# Patient Record
Sex: Female | Born: 1965 | Race: White | Hispanic: No | Marital: Married | Smoking: Never smoker
Health system: Southern US, Community
[De-identification: ages and names within clinical notes are randomized; demographics above are authoritative.]

## PROBLEM LIST (undated history)

## (undated) DIAGNOSIS — R87629 Unspecified abnormal cytological findings in specimens from vagina: Secondary | ICD-10-CM

## (undated) DIAGNOSIS — N6019 Diffuse cystic mastopathy of unspecified breast: Secondary | ICD-10-CM

## (undated) DIAGNOSIS — F32A Depression, unspecified: Secondary | ICD-10-CM

## (undated) DIAGNOSIS — F419 Anxiety disorder, unspecified: Secondary | ICD-10-CM

## (undated) DIAGNOSIS — H9190 Unspecified hearing loss, unspecified ear: Secondary | ICD-10-CM

## (undated) DIAGNOSIS — K219 Gastro-esophageal reflux disease without esophagitis: Secondary | ICD-10-CM

## (undated) DIAGNOSIS — K644 Residual hemorrhoidal skin tags: Secondary | ICD-10-CM

## (undated) DIAGNOSIS — H9319 Tinnitus, unspecified ear: Secondary | ICD-10-CM

## (undated) DIAGNOSIS — F329 Major depressive disorder, single episode, unspecified: Secondary | ICD-10-CM

## (undated) HISTORY — DX: Unspecified abnormal cytological findings in specimens from vagina: R87.629

## (undated) HISTORY — DX: Gastro-esophageal reflux disease without esophagitis: K21.9

## (undated) HISTORY — DX: Diffuse cystic mastopathy of unspecified breast: N60.19

## (undated) HISTORY — DX: Residual hemorrhoidal skin tags: K64.4

## (undated) HISTORY — DX: Major depressive disorder, single episode, unspecified: F32.9

## (undated) HISTORY — DX: Unspecified hearing loss, unspecified ear: H91.90

## (undated) HISTORY — DX: Anxiety disorder, unspecified: F41.9

## (undated) HISTORY — DX: Depression, unspecified: F32.A

## (undated) HISTORY — DX: Tinnitus, unspecified ear: H93.19

---

## 1997-07-17 HISTORY — PX: TENDON RELEASE: SHX230

## 1997-07-17 HISTORY — PX: BREAST CYST ASPIRATION: SHX578

## 1999-11-29 ENCOUNTER — Other Ambulatory Visit: Admission: RE | Admit: 1999-11-29 | Discharge: 1999-11-29 | Payer: Self-pay | Admitting: Family Medicine

## 2001-02-06 ENCOUNTER — Other Ambulatory Visit: Admission: RE | Admit: 2001-02-06 | Discharge: 2001-02-06 | Payer: Self-pay | Admitting: Family Medicine

## 2002-12-30 ENCOUNTER — Other Ambulatory Visit: Admission: RE | Admit: 2002-12-30 | Discharge: 2002-12-30 | Payer: Self-pay | Admitting: Obstetrics and Gynecology

## 2003-05-31 ENCOUNTER — Inpatient Hospital Stay (HOSPITAL_COMMUNITY): Admission: AD | Admit: 2003-05-31 | Discharge: 2003-05-31 | Payer: Self-pay | Admitting: Obstetrics and Gynecology

## 2003-07-01 ENCOUNTER — Inpatient Hospital Stay (HOSPITAL_COMMUNITY): Admission: AD | Admit: 2003-07-01 | Discharge: 2003-07-04 | Payer: Self-pay | Admitting: Obstetrics and Gynecology

## 2003-08-18 ENCOUNTER — Other Ambulatory Visit: Admission: RE | Admit: 2003-08-18 | Discharge: 2003-08-18 | Payer: Self-pay | Admitting: Obstetrics and Gynecology

## 2004-05-31 ENCOUNTER — Ambulatory Visit: Payer: Self-pay | Admitting: Family Medicine

## 2004-06-27 ENCOUNTER — Ambulatory Visit: Payer: Self-pay | Admitting: Internal Medicine

## 2004-07-07 ENCOUNTER — Ambulatory Visit: Payer: Self-pay | Admitting: Family Medicine

## 2004-08-17 ENCOUNTER — Ambulatory Visit: Payer: Self-pay | Admitting: Family Medicine

## 2004-08-20 ENCOUNTER — Ambulatory Visit: Payer: Self-pay | Admitting: Family Medicine

## 2004-10-13 ENCOUNTER — Ambulatory Visit: Payer: Self-pay | Admitting: Family Medicine

## 2004-12-08 ENCOUNTER — Ambulatory Visit: Payer: Self-pay | Admitting: Family Medicine

## 2005-02-10 ENCOUNTER — Ambulatory Visit: Payer: Self-pay | Admitting: Family Medicine

## 2005-05-17 ENCOUNTER — Ambulatory Visit: Payer: Self-pay | Admitting: Family Medicine

## 2006-04-16 ENCOUNTER — Encounter: Payer: Self-pay | Admitting: Family Medicine

## 2006-05-09 ENCOUNTER — Ambulatory Visit: Payer: Self-pay | Admitting: Family Medicine

## 2006-11-12 ENCOUNTER — Encounter: Payer: Self-pay | Admitting: Family Medicine

## 2006-11-12 DIAGNOSIS — H9190 Unspecified hearing loss, unspecified ear: Secondary | ICD-10-CM | POA: Insufficient documentation

## 2006-11-12 DIAGNOSIS — K219 Gastro-esophageal reflux disease without esophagitis: Secondary | ICD-10-CM | POA: Insufficient documentation

## 2006-11-12 DIAGNOSIS — H9319 Tinnitus, unspecified ear: Secondary | ICD-10-CM | POA: Insufficient documentation

## 2006-11-12 DIAGNOSIS — Z872 Personal history of diseases of the skin and subcutaneous tissue: Secondary | ICD-10-CM | POA: Insufficient documentation

## 2006-11-13 ENCOUNTER — Ambulatory Visit: Payer: Self-pay | Admitting: Family Medicine

## 2007-05-07 ENCOUNTER — Telehealth: Payer: Self-pay | Admitting: Family Medicine

## 2007-05-18 ENCOUNTER — Ambulatory Visit: Payer: Self-pay | Admitting: Family Medicine

## 2007-06-07 ENCOUNTER — Ambulatory Visit: Payer: Self-pay | Admitting: Family Medicine

## 2007-06-14 ENCOUNTER — Ambulatory Visit: Payer: Self-pay | Admitting: Family Medicine

## 2007-06-14 DIAGNOSIS — J019 Acute sinusitis, unspecified: Secondary | ICD-10-CM | POA: Insufficient documentation

## 2008-04-20 ENCOUNTER — Ambulatory Visit: Payer: Self-pay | Admitting: Family Medicine

## 2008-06-04 ENCOUNTER — Encounter: Admission: RE | Admit: 2008-06-04 | Discharge: 2008-06-04 | Payer: Self-pay | Admitting: Obstetrics and Gynecology

## 2008-07-21 ENCOUNTER — Telehealth: Payer: Self-pay | Admitting: Family Medicine

## 2008-07-27 ENCOUNTER — Ambulatory Visit: Payer: Self-pay | Admitting: Family Medicine

## 2009-07-20 ENCOUNTER — Other Ambulatory Visit: Admission: RE | Admit: 2009-07-20 | Discharge: 2009-07-20 | Payer: Self-pay | Admitting: Family Medicine

## 2009-07-20 ENCOUNTER — Ambulatory Visit: Payer: Self-pay | Admitting: Family Medicine

## 2009-07-20 LAB — HM PAP SMEAR

## 2009-07-21 LAB — CONVERTED CEMR LAB
ALT: 19 units/L (ref 0–35)
Alkaline Phosphatase: 44 units/L (ref 39–117)
Basophils Relative: 0.8 % (ref 0.0–3.0)
Bilirubin, Direct: 0.1 mg/dL (ref 0.0–0.3)
Creatinine, Ser: 0.6 mg/dL (ref 0.4–1.2)
Eosinophils Absolute: 0.1 10*3/uL (ref 0.0–0.7)
Glucose, Bld: 89 mg/dL (ref 70–99)
Hemoglobin: 11.6 g/dL — ABNORMAL LOW (ref 12.0–15.0)
LDL Cholesterol: 120 mg/dL — ABNORMAL HIGH (ref 0–99)
MCHC: 32.3 g/dL (ref 30.0–36.0)
MCV: 91.6 fL (ref 78.0–100.0)
Monocytes Absolute: 0.5 10*3/uL (ref 0.1–1.0)
Monocytes Relative: 7.7 % (ref 3.0–12.0)
Neutro Abs: 4 10*3/uL (ref 1.4–7.7)
Potassium: 3.4 meq/L — ABNORMAL LOW (ref 3.5–5.1)
RBC: 3.92 M/uL (ref 3.87–5.11)
Sodium: 140 meq/L (ref 135–145)
Total Bilirubin: 0.9 mg/dL (ref 0.3–1.2)
VLDL: 14.2 mg/dL (ref 0.0–40.0)
WBC: 6.2 10*3/uL (ref 4.5–10.5)

## 2009-07-28 ENCOUNTER — Encounter (INDEPENDENT_AMBULATORY_CARE_PROVIDER_SITE_OTHER): Payer: Self-pay | Admitting: *Deleted

## 2009-08-11 ENCOUNTER — Encounter: Payer: Self-pay | Admitting: Family Medicine

## 2009-08-16 ENCOUNTER — Encounter (INDEPENDENT_AMBULATORY_CARE_PROVIDER_SITE_OTHER): Payer: Self-pay

## 2009-08-17 ENCOUNTER — Ambulatory Visit: Payer: Self-pay | Admitting: Gastroenterology

## 2009-09-01 ENCOUNTER — Ambulatory Visit: Payer: Self-pay | Admitting: Gastroenterology

## 2009-09-27 ENCOUNTER — Encounter: Admission: RE | Admit: 2009-09-27 | Discharge: 2009-09-27 | Payer: Self-pay | Admitting: Family Medicine

## 2009-09-27 LAB — HM MAMMOGRAPHY: HM Mammogram: NORMAL

## 2009-09-30 DIAGNOSIS — N63 Unspecified lump in unspecified breast: Secondary | ICD-10-CM | POA: Insufficient documentation

## 2009-10-05 ENCOUNTER — Encounter: Payer: Self-pay | Admitting: Family Medicine

## 2009-10-12 ENCOUNTER — Ambulatory Visit: Payer: Self-pay | Admitting: Family Medicine

## 2009-10-15 ENCOUNTER — Encounter: Payer: Self-pay | Admitting: Family Medicine

## 2009-10-15 ENCOUNTER — Encounter: Admission: RE | Admit: 2009-10-15 | Discharge: 2009-10-15 | Payer: Self-pay | Admitting: Family Medicine

## 2009-10-18 ENCOUNTER — Encounter (INDEPENDENT_AMBULATORY_CARE_PROVIDER_SITE_OTHER): Payer: Self-pay | Admitting: *Deleted

## 2009-12-29 ENCOUNTER — Ambulatory Visit: Payer: Self-pay | Admitting: Family Medicine

## 2010-03-07 ENCOUNTER — Telehealth: Payer: Self-pay | Admitting: Family Medicine

## 2010-04-06 ENCOUNTER — Ambulatory Visit: Payer: Self-pay | Admitting: Family Medicine

## 2010-04-06 DIAGNOSIS — K644 Residual hemorrhoidal skin tags: Secondary | ICD-10-CM | POA: Insufficient documentation

## 2010-07-19 ENCOUNTER — Telehealth: Payer: Self-pay | Admitting: Family Medicine

## 2010-07-20 ENCOUNTER — Ambulatory Visit
Admission: RE | Admit: 2010-07-20 | Discharge: 2010-07-20 | Payer: Self-pay | Source: Home / Self Care | Attending: Internal Medicine | Admitting: Internal Medicine

## 2010-07-20 LAB — CONVERTED CEMR LAB
Nitrite: POSITIVE
Protein, U semiquant: UNDETERMINED

## 2010-07-21 ENCOUNTER — Encounter: Payer: Self-pay | Admitting: Family Medicine

## 2010-08-07 ENCOUNTER — Encounter: Payer: Self-pay | Admitting: Family Medicine

## 2010-08-18 NOTE — Procedures (Signed)
Summary: Colonoscopy  Patient: Frances Aguilar Note: All result statuses are Final unless otherwise noted.  Tests: (1) Colonoscopy (COL)   COL Colonoscopy           DONE     [facility]     [address]     [place], [place]  [address]           COLONOSCOPY PROCEDURE REPORT           PATIENT:  Frances Aguilar, Frances Aguilar  MR#:  [id number]     BIRTHDATE:  Dec 23, 1965, 43 yrs. old  GENDER:  female           ENDOSCOPIST:  Rachael Fee, MD     Referred by:  Marne A. Milinda Antis, M.D.           PROCEDURE DATE:  09/01/2009     PROCEDURE:  Colonoscopy, Diagnostic     ASA CLASS:  Class II     INDICATIONS:  Elevated Risk Screening.father had colon cancer in     his 22's           MEDICATIONS:   Fentanyl 75 mcg IV, Versed 7 mg IV           DESCRIPTION OF PROCEDURE:   After the risks benefits and     alternatives of the procedure were thoroughly explained, informed     consent was obtained.  Digital rectal exam was performed and     revealed no rectal masses.   The LB [id number]-[id number] endoscope     was introduced through the anus and advanced to the cecum, which     was identified by both the appendix and ileocecal valve, without     limitations.  The quality of the prep was excellent, using     MoviPrep.  The instrument was then slowly withdrawn as the colon     was fully examined.     <<PROCEDUREIMAGES>>           FINDINGS:  Mild diverticulosis was found sigmoid to descending     colon segments.  External hemorrhoids were found. These were     small, not thrombosed.  This was otherwise a normal examination of     the colon (see image1, image2, and image4).   Retroflexed views in     the rectum revealed no abnormalities.    The scope was then     withdrawn from the patient and the procedure completed.           COMPLICATIONS:  None           ENDOSCOPIC IMPRESSION:     1) Mild diverticulosis in the sigmoid to descending colon     segments     2) External hemorrhoids, small and not  thrombosed     3) Otherwise normal examination; no polyps or cancers           RECOMMENDATIONS:     1) Given your significant family history of colon cancer, you     should have a repeat colonoscopy in 5 years           REPEAT EXAM:  5 years           ______________________________     Rachael Fee, MD           n.     eSIGNED:   Rachael Fee at 09/01/2009 09:44 AM           Ranelle Oyster, [id number]  Note: An exclamation mark (!) indicates a result that was not dispersed into the flowsheet. Document Creation Date: 09/01/2009 9:44 AM _______________________________________________________________________  (1) Order result status: Final Collection or observation date-time: 09/01/2009 09:38 Requested date-time:  Receipt date-time:  Reported date-time:  Referring Physician:   Ordering Physician: Rob Bunting [phone]) Specimen Source:  Source: Launa Grill Order Number: [id number] Lab site:   Appended Document: Colonoscopy    Clinical Lists Changes  Observations: Added new observation of COLONNXTDUE: 08/2014 (09/01/2009 11:13)

## 2010-08-18 NOTE — Progress Notes (Signed)
Summary: wants to change birth control  Phone Note Call from Patient Call back at Home Phone [phone]   Caller: Patient Call For: [name] Summary of Call: Pt states she has been on depo provera since january.  She is continuing to have spotting everyday and she is having cramps in her feet.  She wants to change to something else, maybe lybrell pills.  [facility].  Her next depo injection is due 9/02, and she wants to get on something else in enough time to have continuous contraception. Initial call taken by: Lowella Petties CMA,  March 07, 2010 2:08 PM  Follow-up for Phone Call        px written on EMR for call in for lybrell start the pill when next depo shot is due f/u if menstrual problems do not improve Follow-up by: [name],  March 07, 2010 3:45 PM  Additional Follow-up for Phone Call Additional follow up Details #1::        [facility] as instructed. Patient notified as instructed by telephone. Pt wants to know if needs to use another form of contraception when she starts this new med and if so how many weeks.Lewanda Rife LPN  March 07, 2010 4:03 PM     Additional Follow-up for Phone Call Additional follow up Details #2::    yes- use barrier like condom for first 2 weeks Follow-up by: [name],  March 07, 2010 4:07 PM  Additional Follow-up for Phone Call Additional follow up Details #3:: Details for Additional Follow-up Action Taken: Patient notified as instructed by telephone. Lewanda Rife LPN  March 07, 2010 4:41 PM   New/Updated Medications: LYBREL 90-20 MCG TABS (LEVONORGESTREL-ETHINYL ESTRAD) take 1 by mouth once daily continuously Prescriptions: LYBREL 90-20 MCG TABS (LEVONORGESTREL-ETHINYL ESTRAD) take 1 by mouth once daily continuously  #90 x 3   Entered and Authorized by:   [name]   Signed by:   [name] on 03/07/2010   Method used:   Telephoned to ...       [facility]   [address] (retail)       [address]       [place], [place]  [address]       Ph: [phone] or [phone]       Fax: [phone]   RxID:   [id number]

## 2010-08-18 NOTE — Letter (Signed)
Summary: Results Follow up Letter  [place] [facility]  [address] [place], [place] [address]   Phone: [phone]  Fax: [phone]    10/18/2009 MRN: [id number]    Frances Aguilar [address] [place], [place]  [address]    Dear [name],  The following are the results of your recent test(s):  Test         Result    Pap Smear:        Normal _____  Not Normal _____ Comments: ______________________________________________________ Cholesterol: LDL(Bad cholesterol):         Your goal is less than:         HDL (Good cholesterol):       Your goal is more than: Comments:  ______________________________________________________ Mammogram:        Normal __X___  Not Normal _____ Comments:   Follow up breast ultrasound is negative.  Yearly follow up is recommended.   ___________________________________________________________________ Hemoccult:        Normal _____  Not normal _______ Comments:    _____________________________________________________________________ Other Tests:    We routinely do not discuss normal results over the telephone.  If you desire a copy of the results, or you have any questions about this information we can discuss them at your next office visit.   Sincerely,    Marne A. Milinda Antis, M.D.  MAT:lsf

## 2010-08-18 NOTE — Letter (Signed)
Summary: [facility]  [facility]   Imported By: Maryln Gottron 08/16/2009 15:50:40  _____________________________________________________________________  External Attachment:    Type:   Image     Comment:   External Document

## 2010-08-18 NOTE — Assessment & Plan Note (Signed)
Summary: depo shot/ alc  Nurse Visit   Allergies: 1)  ! * ? Pain Med From A Surgery 2)  ! * Relefan  Medication Administration  Injection # 1:    Medication: Depo-Provera 150mg     Diagnosis: CONTRACEPTIVE MANAGEMENT (ICD-V25.09)    Route: IM    Site: LUOQ gluteus    Exp Date: 10/16/2011    Lot #: [id number]    Mfr: Francisca December    Patient tolerated injection without complications    Given by: Lewanda Rife LPN (October 12, 2009 4:38 PM)  Orders Added: 1)  Depo-Provera 150mg  [J1055] 2)  Admin of Therapeutic Inj  intramuscular or subcutaneous [13086]

## 2010-08-18 NOTE — Letter (Signed)
Summary: [facility] Inst[facility]  [facility]  [address] [place], [place] [address]   Phone: [phone]  Fax: [phone]       Frances Aguilar    1965-09-22    MRN: [id number]        Procedure Day Dorna Bloom:  Wednesday 09/01/2009     Arrival Time:  9:00 am      Procedure Time:  10:00 am     Location of Procedure:                    _x _  [facility] (4th Floor)   PREPARATION FOR COLONOSCOPY WITH MOVIPREP   Starting 5 days prior to your procedure Friday 2/11 do not eat nuts, seeds, popcorn, corn, beans, peas,  salads, or any raw vegetables.  Do not take any fiber supplements (e.g. Metamucil, Citrucel, and Benefiber).  THE DAY BEFORE YOUR PROCEDURE         DATE: Tuesday 2/15  1.  Drink clear liquids the entire day-NO SOLID FOOD  2.  Do not drink anything colored red or purple.  Avoid juices with pulp.  No orange juice.  3.  Drink at least 64 oz. (8 glasses) of fluid/clear liquids during the day to prevent dehydration and help the prep work efficiently.  CLEAR LIQUIDS INCLUDE: Water Jello Ice Popsicles Tea (sugar ok, no milk/cream) Powdered fruit flavored drinks Coffee (sugar ok, no milk/cream) Gatorade Juice: apple, white grape, white cranberry  Lemonade Clear bullion, consomm, broth Carbonated beverages (any kind) Strained chicken noodle soup Hard Candy                             4.  In the morning, mix first dose of MoviPrep solution:    Empty 1 Pouch A and 1 Pouch B into the disposable container    Add lukewarm drinking water to the top line of the container. Mix to dissolve    Refrigerate (mixed solution should be used within 24 hrs)  5.  Begin drinking the prep at 5:00 p.m. The MoviPrep container is divided by 4 marks.   Every 15 minutes drink the solution down to the next mark (approximately 8 oz) until the full liter is complete.   6.  Follow completed prep with 16 oz of clear liquid of your choice (Nothing red or purple).   Continue to drink clear liquids until bedtime.  7.  Before going to bed, mix second dose of MoviPrep solution:    Empty 1 Pouch A and 1 Pouch B into the disposable container    Add lukewarm drinking water to the top line of the container. Mix to dissolve    Refrigerate  THE DAY OF YOUR PROCEDURE      DATE: Wednesday 2/16  Beginning at 5:00 am (5 hours before procedure):         1. Every 15 minutes, drink the solution down to the next mark (approx 8 oz) until the full liter is complete.  2. Follow completed prep with 16 oz. of clear liquid of your choice.    3. You may drink clear liquids until 8:00 am (2 HOURS BEFORE PROCEDURE).   MEDICATION INSTRUCTIONS  Unless otherwise instructed, you should take regular prescription medications with a small sip of water   as early as possible the morning of your procedure.         OTHER INSTRUCTIONS  You will need a responsible adult  at least 46 years of age to accompany you and drive you home.   This person must remain in the waiting room during your procedure.  Wear loose fitting clothing that is easily removed.  Leave jewelry and other valuables at home.  However, you may wish to bring a book to read or  an iPod/MP3 player to listen to music as you wait for your procedure to start.  Remove all body piercing jewelry and leave at home.  Total time from sign-in until discharge is approximately 2-3 hours.  You should go home directly after your procedure and rest.  You can resume normal activities the  day after your procedure.  The day of your procedure you should not:   Drive   Make legal decisions   Operate machinery   Drink alcohol   Return to work  You will receive specific instructions about eating, activities and medications before you leave.    The above instructions have been reviewed and explained to me by   Ulis Rias RN  August 17, 2009 8:19 AM     I fully understand and can verbalize these  instructions _____________________________ Date _________

## 2010-08-18 NOTE — Assessment & Plan Note (Signed)
Summary: pain w/ stool/alc   Vital Signs:  Patient profile:   45 year old female Height:      63.75 inches Weight:      150 pounds BMI:     26.04 Temp:     98.1 degrees F oral Pulse rate:   80 / minute Pulse rhythm:   regular BP sitting:   110 / 70  (left arm) Cuff size:   regular  Vitals Entered By: Linde Gillis CMA Duncan Dull) (April 06, 2010 3:10 PM) CC: hemorroids   History of Present Illness: has developed a hemorroid started on thursday  became painful 2 nights ago prep H soaking in hot tub  had one in the past - but this one is new   is not bleeding  no constipation  eats a lot of raw veg  sitting more at work  no heavy lifting   wt is down 19 lb   Allergies: 1)  ! * ? Pain Med From A Surgery 2)  ! * Relefan  Past History:  Past Medical History: Last updated: 07/20/2009 GERD hearing loss, tinnitis, and hearing aid eczema depression    psych- Dr Sharl Ma  Past Surgical History: Last updated: 11/12/2006 tendon release in R hand breast cyst aspiration 99  Family History: Last updated: 07/20/2009 father asthma, MI, CVA, ALZ, DM, cancer (colon/ prostate/ kidney)  sister asthma, all, DM mother DM brother cancer - non hodgekin's lymphoma   Social History: Last updated: 07/27/2008 non smoker   Risk Factors: Smoking Status: never (11/12/2006)  Review of Systems General:  Denies chills, fatigue, fever, and loss of appetite. CV:  Denies chest pain or discomfort and palpitations. Resp:  Denies cough. GI:  Complains of change in bowel habits, constipation, and hemorrhoids; denies abdominal pain, bloody stools, indigestion, nausea, and vomiting. GU:  Denies dysuria. Derm:  Denies rash. Heme:  Denies abnormal bruising.  Physical Exam  General:  Well-developed,well-nourished,in no acute distress; alert,appropriate and cooperative throughout examination Head:  normocephalic, atraumatic, and no abnormalities observed.   Mouth:  pharynx pink and  moist.   Neck:  supple with full rom and no masses or thyromegally, no JVD or carotid bruit  Lungs:  Normal respiratory effort, chest expands symmetrically. Lungs are clear to auscultation, no crackles or wheezes. Heart:  Normal rate and regular rhythm. S1 and S2 normal without gallop, murmur, click, rub or other extra sounds. Abdomen:  Bowel sounds positive,abdomen soft and non-tender without masses, organomegaly or hernias noted. Rectal:  large external hemorroid posteriorly- mildly tender and soft/ non thrombosed  nl rectal exm heme neg stool Skin:  Intact without suspicious lesions or rashes Cervical Nodes:  No lymphadenopathy noted Inguinal Nodes:  No significant adenopathy Psych:  normal affect, talkative and pleasant    Impression & Recommendations:  Problem # 1:  HEMORRHOIDS, EXTERNAL (ICD-455.3) Assessment New  external hemorroid- tender but not thrombosed  handout given from [facility]afp on hemorrhoid care  anusol hc cream two times a day  keep clean stressed imp of not straining at all  update if not imp   Orders: Prescription Created Electronically [phone])  Complete Medication List: 1)  Diprolene Af 0.05 % Crea (Aug betamethasone dipropionate) .... To affected area once daily as needed 2)  Zantac 150 Mg Tabs (Ranitidine hcl) .... Take one tablet by mouth daily 3)  Cymbalta 60 Mg Cpep (Duloxetine hcl) .... Take one by mouth daily 4)  Lybrel 90-20 Mcg Tabs (Levonorgestrel-ethinyl estrad) .... Take 1 by mouth once daily continuously 5)  Anusol-hc 2.5 % Crea (Hydrocortisone) .... Apply to affected area two times a day for 14 days  Patient Instructions: 1)  keep area clean  2)  avoid straining or getting constipated / avoid heavy lifting  3)  use the anusol hc cream and update me if not improving  4)  try some ice  Prescriptions: ANUSOL-HC 2.5 % CREA (HYDROCORTISONE) apply to affected area two times a day for 14 days  #1 medium x 0   Entered and Authorized by:   [name]   Signed by:   [name] on 04/06/2010   Method used:   [facility]        [facility]  [address] ([facility]       [address]       [place], [place]  [address]       Ph: [phone] or [phone]       Fax: [phone]   RxID:   [id number]   Current Allergies (reviewed today): ! * ? PAIN MED FROM A SURGERY ! * RELEFAN

## 2010-08-18 NOTE — Miscellaneous (Signed)
Summary: Lec previsit  Clinical Lists Changes  Medications: Added new medication of MOVIPREP 100 GM  SOLR (PEG-KCL-NACL-NASULF-NA ASC-C) As per prep instructions. - Signed Rx of MOVIPREP 100 GM  SOLR (PEG-KCL-NACL-NASULF-NA ASC-C) As per prep instructions.;  #1 x 0;  Signed;  Entered by: Ulis Rias RN;  Authorized by: Rachael Fee MD;  Method used: [facility]  [address] [place], [place]  [address], Ph: [phone] or [phone], Fax: [phone] Allergies: Added new allergy or adverse reaction of * RELEFAN    Prescriptions: MOVIPREP 100 GM  SOLR (PEG-KCL-NACL-NASULF-NA ASC-C) As per prep instructions.  #1 x 0   Entered by:   Ulis Rias RN   Authorized by:   Rachael Fee MD   Signed by:   Ulis Rias RN on 08/17/2009   Method used:   [facility] to        [facility]  [address] (retail)       [address]       [place], [place]  [address]       Ph: [phone] or [phone]       Fax: [phone]   RxID:   [id number]

## 2010-08-18 NOTE — Progress Notes (Signed)
Summary: call a nurse   Phone Note Call from Patient   Call For: [name] Summary of Call: Triage Record Num: [id number] Operator: Patriciaann Clan Patient Name: Frances Aguilar Call Date & Time: 07/18/2010 1:39:01PM Patient Phone: [phone] PCP: Audrie Gallus. Kalab Camps Patient Gender: Female PCP Fax : Patient DOB: 09-25-1965 Practice Name: [facility] Reason for Call: LMP-has Mirena IUD. Patient calling. States developed urianry urgency, frequency, burning with uriantion. States accompanied by heamturia. Onset 07/18/10 a.m. Denies flank pain or abdominal pain. Afebrile. Patient states she purchased OTC Azo and is going to try Azo. Care advice given per guidelines. Advised increased fluids, avoid carbonated, caffienated, surgery, spicy beverages/foods. Advised Ibuprofen, increased water. Call back parameters reviewed. Patient advised to be seen in UC/ED if fever, flank pain, increased sx. Advised to call office 07/19/10 for appt. Patient verbalizes understanding and agreeable. Protocol(s) Used: Bloody Urine Protocol(s) Used: Urinary Symptoms - Female Recommended Outcome per Protocol: See Provider within 24 hours Reason for Outcome: Blood in urine Has one or more urinary tract symptoms Care Advice:  ~ Go to the ED IMMEDIATELY if repeated vomiting or severe pain occurs.  ~ Call provider if you develop flank or low back pain, fever, chills, or generally feel sick.  ~ Tell provider medical history of renal disease; especially if have only one kidney.  ~ SYMPTOM / CONDITION MANAGEMENT Limit carbonated, alcoholic, and caffeinated beverages such as coffee, tea and soda. Avoid nonprescription cold and allergy medications that contain caffeine. Limit intake of tomatoes, fruit juices (except for unsweetened cranberry juice), dairy products, spicy foods, sugar, and artificial sweeteners (aspartame or saccharine). Stop or decrease smoking. Reducing exposure to bladder irritants may help lessen  urgency.  ~ Increase intake of fluids to 8-16 eight oz. glasses (2,000 to 4,000 cc per day or 1.6 to 3.2 L) so urine is a pale yellow color, to help flush bacte Initial call taken by: Melody Comas,  July 19, 2010 11:34 AM

## 2010-08-18 NOTE — Assessment & Plan Note (Signed)
Summary: Frances Aguilar / [name]  Nurse Visit   Allergies: 1)  ! * ? Pain Med From A Surgery 2)  ! * Relefan  Medication Administration  Injection # 1:    Medication: Depo-Provera 150mg     Diagnosis: CONTRACEPTIVE MANAGEMENT (ICD-V25.09)    Route: IM    Site: RUOQ gluteus    Exp Date: 06/15/2012    Lot #: [id number]    Mfr: Francisca December    Patient tolerated injection without complications    Given by: Delilah Shan CMA Duncan Dull) (December 29, 2009 8:46 AM)  Orders Added: 1)  Admin of Therapeutic Inj  intramuscular or subcutaneous [96372] 2)  Depo-Provera 150mg  [J1055]   Medication Administration  Injection # 1:    Medication: Depo-Provera 150mg     Diagnosis: CONTRACEPTIVE MANAGEMENT (ICD-V25.09)    Route: IM    Site: RUOQ gluteus    Exp Date: 06/15/2012    Lot #: [id number]    Mfr: Francisca December    Patient tolerated injection without complications    Given by: Delilah Shan CMA Duncan Dull) (December 29, 2009 8:46 AM)  Orders Added: 1)  Admin of Therapeutic Inj  intramuscular or subcutaneous [96372] 2)  Depo-Provera 150mg  [J1055]   Appended Document: DEPO SHOT / [name] Due again between Aug 30 and Sept 14.

## 2010-08-18 NOTE — Miscellaneous (Signed)
Summary: pap results  Clinical Lists Changes  Observations: Added new observation of PAP SMEAR: normal (07/28/2009 10:48)      Preventive Care Screening  Pap Smear:    Date:  07/28/2009    Results:  normal

## 2010-08-18 NOTE — Assessment & Plan Note (Signed)
Summary: uit/alc   Vital Signs:  Patient profile:   45 year old female Weight:      151.50 pounds Temp:     98.6 degrees F oral Pulse rate:   80 / minute Pulse rhythm:   regular BP sitting:   122 / 76  (left arm) Cuff size:   regular  Vitals Entered By: Selena Batten Dance CMA (AAMA) (July 20, 2010 11:30 AM) CC: ? UTI/ Check left eye Comments Mirena placed 2 weeks ago   History of Present Illness: CC: ?kidney infection  3d h/o frequency, blood in urine, burning sensation at end of voiding, + urgency.  + suprapubic pain when going to bathroom.  No fevers/chills, n/v, back pain.  took Azo which didn't seem to help.  Drinking cranberry juice and water.  L eye - ? allergic reaction to makeup.  + burning and itching.  has put on some lotion and vaseline.  Has stopped makeup.  + itching around eye.  No eye problems, vision changes.  has lost 30 lbs in last year!  with dietary changes, less carbs, more fruits and protein  Current Medications (verified): 1)  Diprolene Af 0.05 % Crea (Aug Betamethasone Dipropionate) .... To Affected Area Once Daily As Needed 2)  Zantac 150 Mg Tabs (Ranitidine Hcl) .... Take One Tablet By Mouth Daily 3)  Cymbalta 60 Mg Cpep (Duloxetine Hcl) .... Take One By Mouth Daily 4)  Mirena 20 Mcg/24hr Iud (Levonorgestrel) .... Inserted  Allergies: 1)  ! * ? Pain Med From A Surgery 2)  ! * Relefan  Past History:  Past Medical History: Last updated: 07/20/2009 GERD hearing loss, tinnitis, and hearing aid eczema depression    psych- Dr Sharl Ma  Social History: Last updated: 07/27/2008 non smoker   Review of Systems       per HPI  Physical Exam  General:  Well-developed,well-nourished,in no acute distress; alert,appropriate and cooperative throughout examination Head:  normocephalic, atraumatic, and no abnormalities observed.   Eyes:  vision grossly intact, pupils equal, pupils round, and pupils reactive to light.  no conjunctival pallor, injection or  icterus .  + dry erythematous patch superior to R eyelid, + L lower eyelid swollen and erythematous Ears:  Tms clear.  hearing aids. Nose:  externally withoutdeformity Mouth:  MMM, no pharyngeal erythema Neck:  no LAD Lungs:  Normal respiratory effort, chest expands symmetrically. Lungs are clear to auscultation, no crackles or wheezes. Heart:  Normal rate and regular rhythm. S1 and S2 normal without gallop, murmur, click, rub or other extra sounds. Abdomen:  Bowel sounds positive,abdomen soft and non-tender without masses, organomegaly or hernias noted.  + suprapubic pressure. Pulses:  2+ rad pulses Extremities:  no pedal edema   Impression & Recommendations:  Problem # 1:  UTI (ICD-599.0) UA/micro consistent with UTI.  treat with cipro x 5 days.  UCx sent.  update if red flags.  Her updated medication list for this problem includes:    Ciprofloxacin Hcl 500 Mg Tabs (Ciprofloxacin hcl) .Marland Kitchen... Take one twice daily x 5 days  Orders: UA Dipstick W/ Micro (manual) ([address]) Specimen Handling (99000) T-Culture, Urine ([id number])  Problem # 2:  ECZEMA, HX OF (ICD-V13.3) under eyelid.  given near eye, ticrolimus.  abstain from makeup for next few weeks.  update if not better.  Complete Medication List: 1)  Diprolene Af 0.05 % Crea (Aug betamethasone dipropionate) .... To affected area once daily as needed 2)  Zantac 150 Mg Tabs (Ranitidine hcl) .... Take one tablet  by mouth daily 3)  Cymbalta 60 Mg Cpep (Duloxetine hcl) .... Take one by mouth daily 4)  Mirena 20 Mcg/24hr Iud (Levonorgestrel) .... Inserted 5)  Protopic 0.1 % Oint (Tacrolimus) .... Apply to aa on eyelid two times a day 6)  Ciprofloxacin Hcl 500 Mg Tabs (Ciprofloxacin hcl) .... Take one twice daily x 5 days  Patient Instructions: 1)  Looks like urinary infection.  treat with antibiotics twice daily for 5days.  let us konw if not improving as expected 2)  for eyelid, use protopic, avoid makeup for next 1-2 wks. 3)  Call  us if any worsening of eyelid, any fevers/chills, nausea, or not improving as expected. 4)   Continue to push fluids and get plenty of rest. Prescriptions: CIPROFLOXACIN HCL 500 MG TABS (CIPROFLOXACIN HCL) take one twice daily x 5 days  #10 x 0   Entered and Authorized by:   Eustaquio Boyden  MD   Signed by:   Eustaquio Boyden  MD on 07/20/2010   Method used:   [facility]        [facility]       [address]       [place], [place]  [address]       Ph: [phone] or [phone]       Fax: [phone]   RxID:   [id number] PROTOPIC 0.1 % OINT (TACROLIMUS) apply to AA on eyelid two times a day  #1 x 0   Entered and Authorized by:   Eustaquio Boyden  MD   Signed by:   Eustaquio Boyden  MD on 07/20/2010   Method used:   [facility]        [id number]       [address]       [place], [place]  [address]       Ph: [phone] or [phone]       Fax: [phone]   RxID:   [id number] DIPROLENE AF 0.05 % CREA (AUG BETAMETHASONE DIPROPIONATE) to affected area once daily as needed  #1 medium x 1   Entered and Authorized by:   Eustaquio Boyden  MD   Signed by:   Eustaquio Boyden  MD on 07/20/2010   Method used:   [facility]        [id number]       [address]       [place], [place]  [address]       Ph: [phone] or [phone]       Fax: [phone]   RxID:   [id number]    Orders Added: 1)  UA Dipstick W/ Micro (manual) [[address]] 2)  Specimen Handling [[address]] 3)  T-Culture, Urine [[id number]] 4)  Est. Patient Level III [01751]    Current Allergies (reviewed today): ! * ? PAIN MED FROM A SURGERY ! [facility]  Laboratory Results   Urine Tests  Date/Time Received: July 20, 2010 11:44 AM  Date/Time Reported: July 20, 2010 11:44 AM   Routine Urinalysis   Color: orange Appearance: Cloudy Glucose: negative   (Normal Range: Negative) Bilirubin:  negative   (Normal Range: Negative) Ketone: negative   (Normal Range: Negative) Spec. Gravity: 1.020   (Normal Range: 1.003-1.035) Blood: large   (Normal Range: Negative) pH: 6.0   (Normal Range: 5.0-8.0) Protein: Unable to determine   (Normal Range: Negative) Urobilinogen: 0.2   (Normal Range: 0-1) Nitrite: positive   (Normal Range: Negative) Leukocyte  Esterace: large   (Normal Range: Negative)  Urine Microscopic WBC/HPF: TNTC RBC/HPF: 10-20 Bacteria/HPF: 2+ rods Mucous/HPF: no Epithelial/HPF: 1-5 Crystals/HPF: no Casts/LPF: no Yeast/HPF: no    Comments: read by .................Eustaquio Boyden  MD  July 20, 2010 11:50 AM  UCx sent.

## 2010-08-18 NOTE — Assessment & Plan Note (Signed)
Summary: CPX   Vital Signs:  Patient profile:   45 year old female Height:      63.75 inches Weight:      169 pounds BMI:     29.34 Temp:     98.3 degrees F oral Pulse rate:   80 / minute Pulse rhythm:   regular BP sitting:   114 / 72  (left arm) Cuff size:   regular  Vitals Entered By: Lowella Petties CMA (July 20, 2009 10:46 AM) CC: 30 minute check up   History of Present Illness: here for health mt exam  has been feeling well   wt is down 18 lb from last visit -- is very proud of this eating a lot less than she used to -- started eating 1/2 as much  gradually increased her complex carbs adn fiber  some diet soda  has been a gradual loss  less sweets and now does not crave them   is exercising some - 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 * ? Pain Med From A Surgery  Past History:  Past Surgical History: Last updated: 11/12/2006 tendon release in R hand breast cyst aspiration 99  Family History: Last updated: 07/20/2009 father asthma, MI, CVA, ALZ, DM, cancer (colon/ prostate/ kidney)  sister asthma, all, DM mother DM brother cancer - non hodgekin's lymphoma   Social History: Last updated: 07/27/2008 non smoker   Risk Factors: Smoking Status: never (11/12/2006)  Past Medical History: GERD hearing loss, tinnitis, and hearing  aid eczema depression    psych- Dr Sharl Ma  Family History: father asthma, MI, CVA, ALZ, DM, cancer (colon/ prostate/ kidney)  sister asthma, all, DM mother DM brother cancer - non hodgekin's lymphoma   Review of Systems General:  Denies fatigue, fever, loss of appetite, and malaise. Eyes:  Denies blurring and eye pain. CV:  Denies chest pain or discomfort, lightheadness, and palpitations. Resp:  Denies cough and wheezing. GI:  Denies abdominal pain, bloody stools, change in bowel habits, nausea, and vomiting. GU:  Denies abnormal vaginal bleeding, discharge, and dysuria. MS:  Denies joint pain. Derm:  Denies itching, lesion(s), poor wound healing, and rash. Neuro:  Denies numbness and tingling. Psych:  mood is ok . Endo:  Denies cold intolerance, excessive thirst, excessive urination, and heat intolerance. Heme:  Denies abnormal bruising and bleeding.  Physical Exam  General:  overweight but generally well appearing wt loss noted  Head:  normocephalic, atraumatic, and no abnormalities observed.   Eyes:  vision grossly intact, pupils equal, pupils round, and pupils reactive to light.  no conjunctival pallor, injection or icterus  Ears:  R ear normal and L ear normal.  - hearing aides present Nose:  no nasal discharge.   Mouth:  pharynx pink and moist.   Neck:  supple with full rom and no masses or thyromegally, no JVD or carotid bruit  Chest Wall:  No deformities, masses, or tenderness noted. Breasts:  No mass, nodules, thickening, tenderness, bulging, retraction, inflamation, nipple discharge or skin changes noted.   Lungs:  Normal respiratory effort, chest expands symmetrically. Lungs are clear to auscultation, no crackles or wheezes. Heart:  Normal rate and regular rhythm. S1 and S2 normal without gallop, murmur, click, rub or other extra sounds. Abdomen:  Bowel sounds positive,abdomen soft and non-tender without masses, organomegaly or hernias noted. no renal bruits   Genitalia:  Normal introitus for age, no external lesions, no vaginal discharge, mucosa pink and moist, no vaginal or cervical lesions, no vaginal atrophy, no friaility or hemorrhage, normal uterus size and position, no adnexal masses or tenderness Msk:  No deformity or scoliosis noted of thoracic or lumbar spine.  no acute joint changes  Pulses:  R and L carotid,radial,femoral,dorsalis pedis and posterior tibial pulses are full and equal bilaterally Extremities:  No clubbing, cyanosis, edema, or deformity noted with normal full range of motion of all joints.   Neurologic:  sensation intact to light touch.   Skin:  Intact without suspicious lesions or rashes Cervical Nodes:  No lymphadenopathy noted Axillary Nodes:  No palpable lymphadenopathy Inguinal Nodes:  No significant adenopathy Psych:  normal affect, talkative and pleasant    Impression & Recommendations:  Problem # 1:  HEALTH MAINTENANCE EXAM (ICD-V70.0) Assessment Comment Only reviewed health habits including diet, exercise and skin cancer prevention reviewed health maintenance list and family history commended on healthy habits and wt loss lab today Orders: Venipuncture (29528) TLB-Lipid Panel (80061-LIPID) TLB-BMP (Basic Metabolic Panel-BMET) (80048-METABOL) TLB-CBC Platelet - w/Differential (85025-CBCD) TLB-Hepatic/Liver Function Pnl (80076-HEPATIC) TLB-TSH (Thyroid Stimulating Hormone) (84443-TSH)  Problem # 2:  ROUTINE GYNECOLOGICAL EXAMINATION (ICD-V72.31) Assessment: Comment Only no problems pap and pelvic exam done will start depo provera for contraception- did warn about potential side eff of wt gain- will watch for that  neg urine preg today  Problem # 3:  NEOPLASM, MALIGNANT, COLON, FAMILY HX, FATHER (ICD-V16.0) Assessment: New schedule screening colonoscopy  Orders: Gastroenterology Referral (GI)  Problem # 4:  OTHER SCREENING MAMMOGRAM (ICD-V76.12) Assessment: Comment Only will ref for screening  mammogram to be done after her breast cyst aspirations  encouraged continued self exams  Orders: Radiology Referral (Radiology)  Complete Medication List: 1)  Diprolene Af 0.05 % Crea (Aug betamethasone dipropionate) .... To affected area once daily as needed 2)  Nexium 40 Mg Cpdr (Esomeprazole magnesium) .Marland Kitchen.. 1 by mouth once daily in am about 30 minutes before breakfast 3)  Cymbalta 60 Mg Cpep (Duloxetine hcl) .... Take one by mouth daily  Other Orders: Flu Vaccine 25yrs + [phone]) Admin 1st Vaccine (40102) Admin 1st Vaccine [facility]) [phone]) Depo-Provera 150mg  (J1055) Admin of Therapeutic Inj  intramuscular or subcutaneous (44034) Urine Pregnancy Test  (74259)  Patient Instructions: 1)  we will set up mammogram at check out  2)  we will refer you for colonoscoy at check out  3)  labs today  4)  depo shot today -- will need next one in 3 months  Prescriptions: DIPROLENE AF 0.05 % CREA (AUG BETAMETHASONE DIPROPIONATE) to affected area once daily as needed  #1 medium x 1   Entered and Authorized by:   [name]   Signed by:   [name] on 07/20/2009  Method used:   Print then Give to Patient   RxID:   [id number] NEXIUM 40 MG CPDR (ESOMEPRAZOLE MAGNESIUM) 1 by mouth once daily in am about 30 minutes before breakfast  #30 x 11   Entered and Authorized by:   [name]   Signed by:   [name] on 07/20/2009   Method used:   Print then Give to Patient   RxID:   [id number]   Prior Medications (reviewed today): CYMBALTA 60 MG CPEP (DULOXETINE HCL) take one by mouth daily Current Allergies: ! * ? PAIN MED FROM A SURGERY    Influenza Vaccine    Vaccine Type: Fluvax 3+    Site: left deltoid    Mfr: GlaxoSmithKline    Dose: 0.5 ml    Route: IM    Given by: Lowella Petties CMA    Exp. Date: 01/13/2010    Lot #: [id number]    VIS given: 02/07/07 version given July 20, 2009.  Flu Vaccine Consent Questions    Do you have a history  of severe allergic reactions to this vaccine? no    Any prior history of allergic reactions to egg and/or gelatin? no    Do you have a sensitivity to the preservative Thimersol? no    Do you have a past history of Guillan-Barre Syndrome? no    Do you currently have an acute febrile illness? no    Have you ever had a severe reaction to latex? no    Vaccine information given and explained to patient? yes    Are you currently pregnant? no    Medication Administration  Injection # 1:    Medication: Depo-Provera 150mg     Diagnosis: CONTRACEPTIVE MANAGEMENT (ICD-V25.09)    Route: IM    Site: LUOQ gluteus    Exp Date: 04/2011    Lot #: [id number]    Mfr: [email or web address]    Patient tolerated injection without complications    Given by: Lowella Petties CMA (July 20, 2009 1:10 PM)  Orders Added: 1)  Venipuncture [53664] 2)  TLB-Lipid Panel [80061-LIPID] 3)  TLB-BMP (Basic Metabolic Panel-BMET) [80048-METABOL] 4)  TLB-CBC Platelet - w/Differential [85025-CBCD] 5)  TLB-Hepatic/Liver Function Pnl [80076-HEPATIC] 6)  Flu Vaccine 70yrs + [90658] 7)  Admin 1st Vaccine [90471] 8)  Admin 1st Vaccine [facility]) [40347Q] 9)  TLB-TSH (Thyroid Stimulating Hormone) [84443-TSH] 10)  Gastroenterology Referral [GI] 11)  Depo-Provera 150mg  [J1055] 12)  Admin of Therapeutic Inj  intramuscular or subcutaneous [96372] 13)  Radiology Referral [Radiology] 14)  Est. Patient 40-64 years [99396] 15)  Urine Pregnancy Test  [81025]   Laboratory Results   Urine Tests   Date/Time Reported: July 20, 2009 11:34 AM     Urine HCG: negative

## 2010-08-18 NOTE — Miscellaneous (Signed)
Summary: mammogram screening  Clinical Lists Changes  Observations: Added new observation of MAMMO DUE: 10/2010 (10/15/2009 14:50) Added new observation of MAMMOGRAM: normal (09/27/2009 14:51)      Preventive Care Screening  Mammogram:    Date:  09/27/2009    Next Due:  10/2010    Results:  normal

## 2010-09-22 ENCOUNTER — Other Ambulatory Visit: Payer: Self-pay | Admitting: Obstetrics and Gynecology

## 2010-09-22 DIAGNOSIS — N6310 Unspecified lump in the right breast, unspecified quadrant: Secondary | ICD-10-CM

## 2010-09-28 ENCOUNTER — Other Ambulatory Visit: Payer: Self-pay | Admitting: Obstetrics and Gynecology

## 2010-09-28 ENCOUNTER — Ambulatory Visit
Admission: RE | Admit: 2010-09-28 | Discharge: 2010-09-28 | Disposition: A | Discharge status: Home / Self Care | Payer: BC Managed Care – PPO | Source: Ambulatory Visit | Attending: Obstetrics and Gynecology | Admitting: Obstetrics and Gynecology

## 2010-09-28 DIAGNOSIS — N6310 Unspecified lump in the right breast, unspecified quadrant: Secondary | ICD-10-CM

## 2010-11-09 ENCOUNTER — Encounter: Payer: Self-pay | Admitting: Family Medicine

## 2010-11-09 ENCOUNTER — Ambulatory Visit (INDEPENDENT_AMBULATORY_CARE_PROVIDER_SITE_OTHER): Payer: BC Managed Care – PPO | Admitting: Family Medicine

## 2010-11-09 VITALS — BP 122/80 | HR 80 | Temp 98.4°F | Ht 64.5 in | Wt 154.1 lb

## 2010-11-09 DIAGNOSIS — N390 Urinary tract infection, site not specified: Secondary | ICD-10-CM | POA: Insufficient documentation

## 2010-11-09 DIAGNOSIS — R319 Hematuria, unspecified: Secondary | ICD-10-CM

## 2010-11-09 LAB — POCT URINALYSIS DIPSTICK
Glucose, UA: NEGATIVE
Ketones, UA: NEGATIVE
Nitrite, UA: NEGATIVE
Protein, UA: 30
Spec Grav, UA: 1.03
Urobilinogen, UA: 0.2
pH, UA: 6

## 2010-11-09 MED ORDER — CIPROFLOXACIN HCL 500 MG PO TABS: 500.0000 mg | ORAL_TABLET | Freq: Two times a day (BID) | ORAL | Status: AC

## 2010-11-09 NOTE — Patient Instructions (Signed)
Push fluids, cranberry juice. May use tylenol for discomfort. Treat with cipro 500mg  twice daily for 7 days. Update [place] if not better after this treatment. Urine culture sent.

## 2010-11-09 NOTE — Assessment & Plan Note (Signed)
UA consistent with UTI.  Given recent abx use, send UCx. Update if not better. Treat with cipro 500 bid x 7 days.

## 2010-11-09 NOTE — Progress Notes (Signed)
  Subjective:    Patient ID: Frances Aguilar, female    DOB: 1965/10/09, 45 y.o.   MRN: [id number]  HPI CC: ? UTI  3rd one since January.  2 wks ago thought had UTI so [facility], treated with abx for 3 days (doesn't remember what kind).  Felt better but not fully better.  Yesterday felt nauseated then back started to hurt (lower back).  This am burning sensation but also pain with urination as well as blood in urine, + polyuria, pressure.  No vomiting, fevers/chills.  No abd pain, + pressure.  Over weekend thought had yeast infection, treated with monistat.  Better next day.   Drinking cranberry juice.  Review of Systems Per HPI    Objective:   Physical Exam  Vitals reviewed. Constitutional: She appears well-developed and well-nourished. No distress.  Cardiovascular: Normal rate, regular rhythm, normal heart sounds and intact distal pulses.   No murmur heard. Pulmonary/Chest: Effort normal and breath sounds normal. No respiratory distress. She has no wheezes. She has no rales.  Abdominal: Soft. Bowel sounds are normal. She exhibits no distension. There is no tenderness. There is no rebound.       No CVA tenderness + suprapubic pressure  Skin: Skin is warm and dry. No rash noted.          Assessment & Plan:

## 2010-11-12 LAB — URINE CULTURE: Colony Count: >=100000

## 2010-11-29 NOTE — Assessment & Plan Note (Signed)
[facility]                                 ON-CALL NOTE   NAME:Frances Aguilar, Frances Aguilar                        MRN:          [id number]  DATE:05/18/2007                            DOB:          11/19/1965    PHONE NUMBER:  [phone]   OBJECTIVE:  The patient is sick.  She has been coughing all night and  has had a cold for a few days.  She would like to be seen.   OBJECTIVE:  URI.   PLAN:  Come in to be seen at [facility].   PRIMARY CARE PHYSICIAN:  Marne A. Tower, M.D., home office is [place].     Robert N. Schaller, MD  Electronically Signed    RNS/MedQ  DD: 05/18/2007  DT: 05/19/2007  Job #: [id number] 

## 2010-11-29 NOTE — Assessment & Plan Note (Signed)
[facility]                                 ON-CALL NOTE   NAME:[name], Frances Aguilar                      MRN:          [id number]  DATE:05/18/2007                            DOB:          1965/10/23    Date of birth:  Feb 08, 1966.  Date of interaction:  May 18, 2007, 8:03 a.m.  Phone number:  [phone].   OBJECTIVE:  The patient is sick.  She has been coughing all night with a  cold and was told to come in to be seen this morning.   PRIMARY CARE PHYSICIAN:  Marne A. Milinda Antis, M.D.     Arta Silence, MD  Electronically Signed    RNS/MedQ  DD: 05/18/2007  DT: 05/19/2007  Job #: [id number]

## 2010-11-29 NOTE — Assessment & Plan Note (Signed)
[facility]                                 ON-CALL NOTE   NAME:Frances Aguilar, Frances Aguilar                        MRN:          [id number]  DATE:05/18/2007                            DOB:          1966-05-27    PHONE NUMBER:  [phone]   OBJECTIVE:  The patient is sick.  She has been coughing all night and  has had a cold for a few days.  She would like to be seen.   OBJECTIVE:  URI.   PLAN:  Come in to be seen at [facility].   PRIMARY CARE PHYSICIAN:  Marne A. Tower, M.D., home office is [facility].     Arta Silence, MD  Electronically Signed    RNS/MedQ  DD: 05/18/2007  DT: 05/19/2007  Job #: [id number]

## 2010-12-02 NOTE — H&P (Signed)
NAMEBIRD, TAILOR                           ACCOUNT NO.:  [id number]   MEDICAL RECORD NO.:  [id number]                   PATIENT TYPE:  INP   LOCATION:  9168                                 FACILITY:  [facility]   PHYSICIAN:  Michelle L. Vincente Poli, M.D.            DATE OF BIRTH:  [birth year]   DATE OF ADMISSION:  07/01/2003  DATE OF DISCHARGE:                                HISTORY & PHYSICAL   HISTORY OF PRESENT ILLNESS:  This is a 45 year old, gravida 1, para 0, at 36  weeks and 4 days, with an estimated due date of July 25, 2003.  She was  seen in the office today.  She had an ultrasound for weight, which revealed  an estimated fetal weight of 2880 gm, in the 50th percentile; however, AFI  was shown to be 5 cm.  She is admitted for induction for oligohydramnios.  She is negative group B strep.  She is advanced maternal age.   PHYSICAL EXAMINATION:  VITAL SIGNS:  She is afebrile with stable vital  signs.  LUNGS:  Clear to auscultation bilaterally.  CARDIAC:  Regular rate and rhythm.  ABDOMEN:  She is gravid.  PELVIC:  Cervix is 2-3 cm dilated, and the fetus is vertex.   IMPRESSION:  Oligohydramnios at 36 weeks and 4 days.   PLAN:  Admit for induction.  Will start low-dose Pitocin, and plan of care  discussed with patient.                                               Michelle L. Vincente Poli, M.D.    Florestine Avers  D:  07/01/2003  T:  07/01/2003  Job:  [id number]

## 2011-05-16 ENCOUNTER — Ambulatory Visit: Payer: BC Managed Care – PPO | Admitting: Family Medicine

## 2011-06-27 ENCOUNTER — Ambulatory Visit: Payer: BC Managed Care – PPO

## 2011-09-14 ENCOUNTER — Other Ambulatory Visit: Payer: Self-pay | Admitting: *Deleted

## 2011-09-14 MED ORDER — BETAMETHASONE DIPROPIONATE AUG 0.05 % EX CREA: TOPICAL_CREAM | Freq: Every day | CUTANEOUS | Status: DC

## 2011-09-14 NOTE — Telephone Encounter (Signed)
Will refill electronically  

## 2011-09-14 NOTE — Telephone Encounter (Signed)
Will route to PCP 

## 2011-10-24 ENCOUNTER — Ambulatory Visit: Payer: BC Managed Care – PPO | Admitting: Family Medicine

## 2012-04-04 ENCOUNTER — Other Ambulatory Visit: Payer: Self-pay | Admitting: Obstetrics and Gynecology

## 2012-04-04 DIAGNOSIS — N63 Unspecified lump in unspecified breast: Secondary | ICD-10-CM

## 2012-04-08 ENCOUNTER — Ambulatory Visit
Admission: RE | Admit: 2012-04-08 | Discharge: 2012-04-08 | Disposition: A | Discharge status: Home / Self Care | Payer: BC Managed Care – PPO | Source: Ambulatory Visit | Attending: Obstetrics and Gynecology | Admitting: Obstetrics and Gynecology

## 2012-04-08 DIAGNOSIS — N63 Unspecified lump in unspecified breast: Secondary | ICD-10-CM

## 2012-05-01 ENCOUNTER — Other Ambulatory Visit: Payer: Self-pay | Admitting: Obstetrics and Gynecology

## 2012-09-09 ENCOUNTER — Ambulatory Visit: Payer: BC Managed Care – PPO | Admitting: Family Medicine

## 2012-09-18 ENCOUNTER — Other Ambulatory Visit: Payer: Self-pay | Admitting: *Deleted

## 2012-09-18 ENCOUNTER — Encounter: Payer: Self-pay | Admitting: Family Medicine

## 2012-09-18 ENCOUNTER — Ambulatory Visit (INDEPENDENT_AMBULATORY_CARE_PROVIDER_SITE_OTHER): Payer: BC Managed Care – PPO | Admitting: Family Medicine

## 2012-09-18 ENCOUNTER — Ambulatory Visit (INDEPENDENT_AMBULATORY_CARE_PROVIDER_SITE_OTHER)
Admission: RE | Admit: 2012-09-18 | Discharge: 2012-09-18 | Disposition: A | Discharge status: Home / Self Care | Payer: BC Managed Care – PPO | Source: Ambulatory Visit | Attending: Family Medicine | Admitting: Family Medicine

## 2012-09-18 VITALS — BP 106/66 | HR 73 | Temp 98.8°F | Ht 64.5 in | Wt 154.5 lb

## 2012-09-18 DIAGNOSIS — M79609 Pain in unspecified limb: Secondary | ICD-10-CM

## 2012-09-18 DIAGNOSIS — M25549 Pain in joints of unspecified hand: Secondary | ICD-10-CM

## 2012-09-18 DIAGNOSIS — M79641 Pain in right hand: Secondary | ICD-10-CM

## 2012-09-18 DIAGNOSIS — M79642 Pain in left hand: Secondary | ICD-10-CM

## 2012-09-18 DIAGNOSIS — M79643 Pain in unspecified hand: Secondary | ICD-10-CM

## 2012-09-18 LAB — CBC WITH DIFFERENTIAL/PLATELET
Basophils Relative: 0.7 % (ref 0.0–3.0)
Eosinophils Relative: 2.6 % (ref 0.0–5.0)
HCT: 40.3 % (ref 36.0–46.0)
MCV: 91.4 fl (ref 78.0–100.0)
Monocytes Absolute: 0.4 10*3/uL (ref 0.1–1.0)
Neutrophils Relative %: 66.2 % (ref 43.0–77.0)
RBC: 4.41 Mil/uL (ref 3.87–5.11)
WBC: 6.3 10*3/uL (ref 4.5–10.5)

## 2012-09-18 LAB — SEDIMENTATION RATE: Sed Rate: 6 mm/hr (ref 0–22)

## 2012-09-18 MED ORDER — BETAMETHASONE DIPROPIONATE AUG 0.05 % EX CREA: TOPICAL_CREAM | Freq: Every day | CUTANEOUS | Status: DC

## 2012-09-18 NOTE — Progress Notes (Signed)
Subjective:    Patient ID: Frances Aguilar, female    DOB: 05/07/1966, 47 y.o.   MRN: [id number]  HPI Here with pain in her fingers and hands also stiffness  It worsened after she had a stomach virus a few weeks ago - she began to pay more attention Started less than a year ago  R index finger stays a little swollen and L 4th finger is especially sore  occ feels like a finger gets stuck in position (she has had trigger finger in the past )  No visibly red joints/ no heat  Some mcp pain and some more distal  Father had arthritis in old age in shoulders No autoimmune dz in family    No rash except a spot of dermatitis on side - better after changing laundry detergent  No sun sensitivity  More muscle spasms in shoulders but no other joint complaints   Patient Active Problem List  Diagnosis  . TINNITUS  . HEARING LOSS  . HEMORRHOIDS, EXTERNAL  . SINUSITIS- ACUTE-NOS  . GERD  . BREAST MASS, RIGHT  . ECZEMA, HX OF  . UTI (urinary tract infection)   Past Medical History  Diagnosis Date  . Lump or mass in breast   . Other general counseling and advice for contraceptive management   . Personal history of diseases of skin and subcutaneous tissue   . Esophageal reflux   . Routine general medical examination at a health care facility   . Unspecified hearing loss     hearing aid  . External hemorrhoids without mention of complication   . Family history of malignant neoplasm of gastrointestinal tract   . Other screening mammogram   . Routine gynecological examination   . Acute sinusitis, unspecified   . Unspecified tinnitus   . Depression    Past Surgical History  Procedure Laterality Date  . Tendon release      Right hand  . Breast cyst aspiration  1999   History  Substance Use Topics  . Smoking status: Never Smoker   . Smokeless tobacco: Not on file  . Alcohol Use: Yes     Comment: Rare   Family History  Problem Relation Age of Onset  . Asthma Father   . Heart  attack Father   . Stroke Father   . Alzheimer's disease Father   . Diabetes Father   . Colon cancer Father   . Prostate cancer Father   . Kidney cancer Father   . Diabetes Mother   . Asthma Sister   . Allergies Sister   . Diabetes Sister   . Lymphoma Brother     Non-Hodgkins   Allergies  Allergen Reactions  . Other     ? Pain med from surgery (can't remember the name)  . Relafen (Nabumetone) Other (See Comments)    Ringing in ears   Current Outpatient Prescriptions on File Prior to Visit  Medication Sig Dispense Refill  . augmented betamethasone dipropionate (DIPROLENE-AF) 0.05 % cream Apply topically daily. As needed  30 g  0  . levonorgestrel (MIRENA) 20 MCG/24HR IUD 1 each by Intrauterine route once.         No current facility-administered medications on file prior to visit.       Review of Systems Review of Systems  Constitutional: Negative for fever, appetite change, fatigue and unexpected weight change.  Eyes: Negative for pain and visual disturbance.  Respiratory: Negative for cough and shortness of breath.   Cardiovascular: Negative for  cp or palpitations    Gastrointestinal: Negative for nausea, diarrhea and constipation.  Genitourinary: Negative for urgency and frequency.  Skin: Negative for pallor or redness  MSK pos for hand pain , neg for other joint pain  Neurological: Negative for weakness, light-headedness, numbness and headaches.  Hematological: Negative for adenopathy. Does not bruise/bleed easily.  Psychiatric/Behavioral: Negative for dysphoric mood. The patient is not nervous/anxious.         Objective:   Physical Exam  Constitutional: She appears well-developed and well-nourished. No distress.  HENT:  Head: Normocephalic and atraumatic.  Eyes: Conjunctivae and EOM are normal. Pupils are equal, round, and reactive to light.  Neck: Normal range of motion. Neck supple.  Cardiovascular: Normal rate and regular rhythm.   Pulmonary/Chest: Effort  normal and breath sounds normal.  Abdominal: Soft. Bowel sounds are normal.  Musculoskeletal: She exhibits edema and tenderness.  Mild edema of middle PIP of R index finger No joint deformities present/ no herberdens' nodes  Nl rom fingers but per pt is painful to make a fist bilaterally No crepitus or skin change   Lymphadenopathy:    She has no cervical adenopathy.  Neurological: She is alert. She has normal reflexes. No cranial nerve deficit. She exhibits normal muscle tone. Coordination normal.  Skin: Skin is warm and dry. No erythema. No pallor.  Patch of dry skin on R side of body- flank area No malar rash or redness  Psychiatric: She has a normal mood and affect.          Assessment & Plan:

## 2012-09-18 NOTE — Patient Instructions (Addendum)
Xray and labs today for hand joint pain  Try to warm hands up in morning if they are stiff Tylenol as needed

## 2012-09-18 NOTE — Assessment & Plan Note (Signed)
Xray today Some swelling Also lab for autoimmune process and update

## 2012-09-18 NOTE — Assessment & Plan Note (Signed)
Mixed picture of joint pain/ stiffness and swelling Lab today for autoimmune process Disc use of heat and acetaminophen until results

## 2012-09-19 ENCOUNTER — Telehealth: Payer: Self-pay | Admitting: Family Medicine

## 2012-09-19 DIAGNOSIS — M79641 Pain in right hand: Secondary | ICD-10-CM

## 2012-09-19 NOTE — Telephone Encounter (Signed)
Ref to ortho

## 2012-09-19 NOTE — Telephone Encounter (Signed)
Message copied by Judy Pimple on Thu Sep 19, 2012  1:19 PM ------      Message from: Shon Millet      Created: Thu Sep 19, 2012  1:17 PM       Pt notified of lab results and agrees with referral, I advise her that Shirlee Limerick will call her to set appt up ------

## 2012-12-23 ENCOUNTER — Ambulatory Visit: Payer: BC Managed Care – PPO | Admitting: Internal Medicine

## 2013-02-14 ENCOUNTER — Ambulatory Visit: Payer: BC Managed Care – PPO | Admitting: Internal Medicine

## 2013-02-25 ENCOUNTER — Encounter: Payer: Self-pay | Admitting: Internal Medicine

## 2013-02-25 ENCOUNTER — Ambulatory Visit (INDEPENDENT_AMBULATORY_CARE_PROVIDER_SITE_OTHER): Payer: 59 | Admitting: Internal Medicine

## 2013-02-25 VITALS — BP 99/55 | HR 87 | Temp 98.2°F | Ht 64.75 in | Wt 163.8 lb

## 2013-02-25 DIAGNOSIS — Z872 Personal history of diseases of the skin and subcutaneous tissue: Secondary | ICD-10-CM

## 2013-02-25 DIAGNOSIS — R5381 Other malaise: Secondary | ICD-10-CM

## 2013-02-25 DIAGNOSIS — F329 Major depressive disorder, single episode, unspecified: Secondary | ICD-10-CM

## 2013-02-25 DIAGNOSIS — R5383 Other fatigue: Secondary | ICD-10-CM | POA: Insufficient documentation

## 2013-02-25 DIAGNOSIS — F3289 Other specified depressive episodes: Secondary | ICD-10-CM

## 2013-02-25 DIAGNOSIS — F32A Depression, unspecified: Secondary | ICD-10-CM | POA: Insufficient documentation

## 2013-02-25 LAB — COMPREHENSIVE METABOLIC PANEL
Albumin: 4.1 g/dL (ref 3.5–5.2)
Alkaline Phosphatase: 58 U/L (ref 39–117)
BUN: 13 mg/dL (ref 6–23)
CO2: 27 mEq/L (ref 19–32)
Calcium: 9.1 mg/dL (ref 8.4–10.5)
GFR: 94.79 mL/min (ref >60.00)
Glucose, Bld: 75 mg/dL (ref 70–99)
Potassium: 3.6 mEq/L (ref 3.5–5.1)

## 2013-02-25 LAB — CBC WITH DIFFERENTIAL/PLATELET
Basophils Absolute: 0 10*3/uL (ref 0.0–0.1)
Eosinophils Relative: 2.7 % (ref 0.0–5.0)
HCT: 38.9 % (ref 36.0–46.0)
Lymphocytes Relative: 28.5 % (ref 12.0–46.0)
Monocytes Relative: 7.3 % (ref 3.0–12.0)
Neutrophils Relative %: 60.9 % (ref 43.0–77.0)
Platelets: 198 10*3/uL (ref 150.0–400.0)
WBC: 5.6 10*3/uL (ref 4.5–10.5)

## 2013-02-25 LAB — TSH: TSH: 1.25 u[IU]/mL (ref 0.35–5.50)

## 2013-02-25 NOTE — Progress Notes (Signed)
  Subjective:    Patient ID: Frances Aguilar, female    DOB: 1966/05/21, 47 y.o.   MRN: [id number]  HPI Transferring to this office due to to location. Chart reviewed. In general feeling well except for the last 2 months, feeling weak, generalized fatigue, from time to time needs to take a nap. Hot flashes are also increased for the last 2 months. Went to see her psychiatrist, they increase pristiq dose and did labs.  Past Medical History  Diagnosis Date  . Unspecified hearing loss     hearing aid B  . External hemorrhoids without mention of complication   . Unspecified tinnitus   . Depression   . Fibrocystic breast   . GERD (gastroesophageal reflux disease)     h/o   Past Surgical History  Procedure Laterality Date  . Tendon release  1999    Right hand  . Breast cyst aspiration  1999   History   Social History  . Marital Status: Married    Spouse Name: N/A    Number of Children: 1  . Years of Education: N/A   Occupational History  . works in [employer]    Social History Main Topics  . Smoking status: Never Smoker   . Smokeless tobacco: Never Used  . Alcohol Use: Yes     Comment: Rare  . Drug Use: No  . Sexually Active: Not on file   Other Topics Concern  . Not on file   Social History Narrative   College degree, 1 biological daughter , 1 step daughter   Family History  Problem Relation Age of Onset  . Asthma Mother     M and sister   . Heart attack Neg Hx   . Stroke Neg Hx   . Alzheimer's disease Mother   . Diabetes Mother     M and sister  . Colon cancer Father     dx at age 93  . Prostate cancer Father     dx age 48  . Lymphoma Brother     Non-Hodgkins     Review of Systems No chest pain or shortness or breath No nausea, vomiting, diarrhea. Use to have very heavy periods, now has IUD and has that is completely corrected. Admits to occasional snoring, again she needs to take a nap sometimes, feeling sometimes sleepy but no incidents while driving or  working. Today her BP is in the side but she is asymptomatic as far as dizziness or orthostatic sx     Objective:   Physical Exam BP 99/55  Pulse 87  Temp(Src) 98.2 F (36.8 C) (Oral)  Ht 5' 4.75" (1.645 m)  Wt 163 lb 12.8 oz (74.299 kg)  BMI 27.46 kg/m2  SpO2 98% General -- alert, well-developed, NAD .   Neck --no thyromegaly Lungs -- normal respiratory effort, no intercostal retractions, no accessory muscle use, and normal breath sounds.   Heart-- normal rate, regular rhythm, no murmur, and no gallop.   Abdomen--soft, non-tender, no distention, no masses, no HSM, no guarding, and no rigidity.   Extremities-- no pretibial edema bilaterally  Psych-- Cognition and judgment appear intact. Alert and cooperative with normal attention span and concentration.  not anxious appearing and not depressed appearing.        Assessment & Plan:

## 2013-02-25 NOTE — Patient Instructions (Addendum)
Stay active, eating healthy! Please come back in 4-6 months for a checkup , sooner if you are  more fatigued or snoring more frequently.

## 2013-02-25 NOTE — Assessment & Plan Note (Addendum)
New problem 2 months history of feeling weak, fatigued. She does snore sometimes and feeling sleepy. Admits to a 10 pound weight gain lately. Saw psychiatry, they increased pristiq dose, they also check her folic acid, vitamin B and vitamin D, all normal. Menopause symptoms have increased, to see Dr. Milton Ferguson soon. Plan: Observation, Labs Snoring, sleep apnea? Consider w/u, re-evaluate  on return to the office. See instructions.

## 2013-02-25 NOTE — Assessment & Plan Note (Signed)
Per Dr Evelene Croon, see fatigue

## 2013-02-25 NOTE — Assessment & Plan Note (Signed)
Well-controlled with steroid cream when necessary

## 2013-02-26 ENCOUNTER — Telehealth: Payer: Self-pay | Admitting: Internal Medicine

## 2013-02-26 NOTE — Telephone Encounter (Signed)
Patient called requesting that we write rx for a blood pressure monitor so it will count towards her deductible. Please advise.

## 2013-02-27 ENCOUNTER — Telehealth: Payer: Self-pay | Admitting: *Deleted

## 2013-02-27 MED ORDER — BLOOD PRESSURE MONITOR KIT: 1.0000 | PACK | Status: DC

## 2013-02-27 NOTE — Telephone Encounter (Signed)
Message copied by Shirlee More I on Thu Feb 27, 2013 10:04 AM ------      Message from: Willow Ora E      Created: Thu Feb 27, 2013  9:49 AM       Patient request a BP monitor , needs a prescription. Okay with me, I don't have a diagnosis to be associated with but ok to send a Rx ------

## 2013-02-27 NOTE — Telephone Encounter (Signed)
Orders placed, patient made aware.

## 2013-02-27 NOTE — Telephone Encounter (Signed)
2 messages entered for same question. Handled by Burnett Corrente RN.

## 2013-03-05 ENCOUNTER — Other Ambulatory Visit: Payer: Self-pay | Admitting: Obstetrics and Gynecology

## 2013-03-31 ENCOUNTER — Ambulatory Visit: Payer: BC Managed Care – PPO | Admitting: Internal Medicine

## 2013-04-23 ENCOUNTER — Encounter: Payer: Self-pay | Admitting: Internal Medicine

## 2013-06-30 ENCOUNTER — Ambulatory Visit: Payer: 59 | Admitting: Internal Medicine

## 2013-07-17 HISTORY — PX: CERVICAL DISC SURGERY: SHX588

## 2014-01-24 ENCOUNTER — Encounter: Payer: Self-pay | Admitting: Family Medicine

## 2014-01-24 ENCOUNTER — Ambulatory Visit (INDEPENDENT_AMBULATORY_CARE_PROVIDER_SITE_OTHER): Payer: BC Managed Care – PPO | Admitting: Family Medicine

## 2014-01-24 VITALS — BP 110/78 | HR 80 | Temp 98.7°F | Wt 165.0 lb

## 2014-01-24 DIAGNOSIS — N390 Urinary tract infection, site not specified: Secondary | ICD-10-CM | POA: Insufficient documentation

## 2014-01-24 DIAGNOSIS — N3001 Acute cystitis with hematuria: Secondary | ICD-10-CM

## 2014-01-24 DIAGNOSIS — N3000 Acute cystitis without hematuria: Secondary | ICD-10-CM

## 2014-01-24 DIAGNOSIS — R319 Hematuria, unspecified: Principal | ICD-10-CM

## 2014-01-24 LAB — POCT URINALYSIS DIPSTICK
Bilirubin, UA: NEGATIVE
GLUCOSE UA: NEGATIVE
Ketones, UA: NEGATIVE
Nitrite, UA: POSITIVE
PH UA: 5
SPEC GRAV UA: 1.02
UROBILINOGEN UA: 0.2

## 2014-01-24 MED ORDER — SULFAMETHOXAZOLE-TMP DS 800-160 MG PO TABS: 1.0000 | ORAL_TABLET | Freq: Two times a day (BID) | ORAL | Status: DC

## 2014-01-24 NOTE — Patient Instructions (Signed)
Drink plenty of water and start the antibiotics today.  We'll contact you with your lab report.  Take care.   

## 2014-01-24 NOTE — Assessment & Plan Note (Signed)
Nontoxic, septra, ucx, fluids.  dw pt.  F/u prn.

## 2014-01-24 NOTE — Progress Notes (Signed)
Pre visit review using our clinic review tool, if applicable. No additional management support is needed unless otherwise documented below in the visit note.  Dysuria: yes, used AZO but only helped a little. Burning with urination.  duration of symptoms: in the last 2 days.  Worse today.   abdominal pain: some lower abd discomfort.  Fevers:no, but felt cold back pain: slightly, R lower back Vomiting: no  Meds, vitals, and allergies reviewed.   ROS: See HPI.  Otherwise negative.    GEN: nad, alert and oriented NECK: supple CV: rrr.  PULM: ctab, no inc wob ABD: soft, +bs, suprapubic area slightly tender w/o rebound EXT: no edema SKIN: no acute rash BACK: no CVA pain

## 2014-01-26 ENCOUNTER — Telehealth: Payer: Self-pay | Admitting: Internal Medicine

## 2014-01-26 LAB — URINE CULTURE

## 2014-01-26 NOTE — Telephone Encounter (Signed)
Caller name: Angellynn Relation to pt: Call back number: [phone] [facility] [address]  Reason for call:   Pt was given RX sulfamethoxazole-trimethoprim (BACTRIM DS) 800-160 MG per tablet [facility]. Was Dx with UTI.  Pt is still having burning and pressure.  Pt doing everything that was instructed, no relief.  Pt needs advise.

## 2014-01-26 NOTE — Telephone Encounter (Signed)
Spoke with patient who states that she is still having burning and pressure but has improved some since she started the antibiotics. States that she has one dose left. Advised patient that the culture is only preliminary and is still growing. Advised to complete medication as directed and that Dr Lianne Bushyuncan's office would contact her with the results. But is she has any problems in the meantime to let us know. Patient verbalizes understanding.

## 2014-01-27 ENCOUNTER — Telehealth: Payer: Self-pay | Admitting: *Deleted

## 2014-01-27 MED ORDER — SULFAMETHOXAZOLE-TMP DS 800-160 MG PO TABS: 1.0000 | ORAL_TABLET | Freq: Two times a day (BID) | ORAL | Status: DC

## 2014-01-27 NOTE — Telephone Encounter (Signed)
Left detailed message on voicemail.  

## 2014-01-27 NOTE — Telephone Encounter (Signed)
Patient received her message concerning the results of her urine culture and states that she is still burning with urination and has completed her 3 day course of medication.  Please advise.

## 2014-01-27 NOTE — Telephone Encounter (Signed)
Retreat with septra due to sensitivities.  If not better, then f/u with PCP. Thanks. rx sent.

## 2014-09-05 ENCOUNTER — Encounter: Payer: Self-pay | Admitting: Internal Medicine

## 2014-09-05 ENCOUNTER — Ambulatory Visit (INDEPENDENT_AMBULATORY_CARE_PROVIDER_SITE_OTHER): Payer: Managed Care, Other (non HMO) | Admitting: Internal Medicine

## 2014-09-05 VITALS — BP 90/70 | HR 86 | Temp 98.5°F | Ht 64.75 in | Wt 154.0 lb

## 2014-09-05 DIAGNOSIS — G4700 Insomnia, unspecified: Secondary | ICD-10-CM

## 2014-09-05 DIAGNOSIS — R5383 Other fatigue: Secondary | ICD-10-CM

## 2014-09-05 MED ORDER — TRAZODONE HCL 50 MG PO TABS: 25.0000 mg | ORAL_TABLET | Freq: Every evening | ORAL | Status: DC | PRN

## 2014-09-05 NOTE — Progress Notes (Signed)
Pre visit review using our clinic review tool, if applicable. No additional management support is needed unless otherwise documented below in the visit note.   Chief Complaint  Patient presents with  . Insomnia    C/O trouble staying asleep consistently. Typicaly lays in bed wide awake between 1-3. ? related to Perimenopause. Also reports fatigue due to lack of sleep    HPI: Patient comes in today for [facility] for  new problem evaluation.   Pt called yesterday fo ongoing problem .  Has gyne Dr. Vincente Poli to have check up in April . Insidious onset of sleep issues for month s Having 5-6 weeks of increasing  . Awakening 1-3 am and cant go back to sleep."Wide awake"  Uncertain cause ? Menopausal.  No etoh, caffiene late ocass melatonin avoid back lighting   Now very tired later in day  ocass nap trying to avoid.  No recent travel upheavals at home work  Or medical status change . Generally healthy some hot flushes .  Sister had menopausal sleep issues and got better with hormonal therapy.  ROS: See pertinent positives and negatives per HPI. 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 Married   Past Medical History  Diagnosis Date  . Unspecified hearing loss     hearing aid B  . External hemorrhoids without mention of complication   . Unspecified tinnitus   . Depression   . Fibrocystic breast   . GERD (gastroesophageal reflux disease)     h/o    Family History  Problem Relation Age of Onset  . Asthma Mother     M and sister   . Heart attack Neg Hx   . Stroke Neg Hx   . Alzheimer's disease Mother   . Diabetes Mother     M and sister  . Colon cancer Father     dx at age 3  . Prostate cancer Father     dx age 85  . Lymphoma Brother     Non-Hodgkins    History   Social History  . Marital Status: Married    Spouse Name: N/A  . Number of Children: 1  . Years of Education: N/A   Occupational History  .  works in [employer]    Social History Main Topics  . Smoking status: Never Smoker   . Smokeless tobacco: Never Used  . Alcohol Use: Yes     Comment: Rare  . Drug Use: No  . Sexual Activity: Not on file   Other Topics Concern  . None   Social History Narrative   College degree, 1 biological daughter , 1 step daughter    Outpatient Encounter Prescriptions as of 09/05/2014  Medication Sig  . levonorgestrel (MIRENA) 20 MCG/24HR IUD 1 each by Intrauterine route once.    . Multiple Vitamins-Minerals (MULTIVITAMIN PO) Take 1 tablet by mouth daily.  . traZODone (DESYREL) 50 MG tablet Take 0.5-1 tablets (25-50 mg total) by mouth at bedtime as needed for sleep.  . [DISCONTINUED] sulfamethoxazole-trimethoprim (BACTRIM DS) 800-160 MG per tablet Take 1 tablet by mouth 2 (two) times daily. To complete 7 day course    EXAM:  BP 90/70 mmHg  Pulse 86  Temp(Src) 98.5 F (36.9 C) (Oral)  Ht 5' 4.75" (1.645 m)  Wt 154 lb (69.854 kg)  BMI 25.81 kg/m2  SpO2 98%  Body mass index is 25.81 kg/(m^2).  GENERAL: vitals  reviewed and listed above, alert, oriented, appears well hydrated and in no acute distress HEENT: atraumatic, conjunctiva  clear, no obvious abnormalities on inspection of external nose and ears MS: moves all extremities without noticeable focal  abnormality PSYCH: pleasant and cooperative, no obvious depression or anxiety looks tired ocass yawning   ASSESSMENT AND PLAN:  Discussed the following assessment and plan:  Insomnia  Other fatigue - prob from sleep interruption Mid cycle wakening newer onset over the last year . Should be seen by pcp team dr Vincente Poligrewal and dr Drue NovelPaz if appropriate .  Poss menopausal change   Trigger based on family hx and timing .  Get appt with dr Vincente PoliGrewal sooner than her yearly.  Seems to do generally good sleepy hygiene. Fatigue from sleep deprivations  mostl like ly  Counseled. I do not routinely prescribe ambien  Esp  When not the pcp   Can try trazodone  With  caution but advise get appt sooner with GYNE to see if hormonal or other therapy would help . If not then appt with dr Drue NovelPaz as pcp .  -Patient advised to return or notify health care team  if  new concerns arise. Before plan  Total visit 25mins > 50% spent counseling and coordinating care    Patient Instructions  Menopausal can sometimes  Trigger  Sleep issues .  Awakenings.    Sleep deprived   Can cause fatigue for  this . Continue  Managing   Do not look at clock  continue to routine  Light in am and mid day and  Very dark .    Melatonin  3-4 hours pre sleep .  considier moving up appt  With Dr Vincente PoliGrewal.     Insomnia Insomnia is frequent trouble falling and/or staying asleep. Insomnia can be a long term problem or a short term problem. Both are common. Insomnia can be a short term problem when the wakefulness is related to a certain stress or worry. Long term insomnia is often related to ongoing stress during waking hours and/or poor sleeping habits. Overtime, sleep deprivation itself can make the problem worse. Every little thing feels more severe because you are overtired and your ability to cope is decreased. CAUSES   Stress, anxiety, and depression.  Poor sleeping habits.  Distractions such as TV in the bedroom.  Naps close to bedtime.  Engaging in emotionally charged conversations before bed.  Technical reading before sleep.  Alcohol and other sedatives. They may make the problem worse. They can hurt normal sleep patterns and normal dream activity.  Stimulants such as caffeine for several hours prior to bedtime.  Pain syndromes and shortness of breath can cause insomnia.  Exercise late at night.  Changing time zones may cause sleeping problems (jet lag). It is sometimes helpful to have someone observe your sleeping patterns. They should look for periods of not breathing during the night (sleep apnea). They should also look to see how long those periods last. If you live  alone or observers are uncertain, you can also be observed at a sleep clinic where your sleep patterns will be professionally monitored. Sleep apnea requires a checkup and treatment. Give your caregivers your medical history. Give your caregivers observations your family has made about your sleep.  SYMPTOMS   Not feeling rested in the morning.  Anxiety and restlessness at bedtime.  Difficulty falling and staying asleep. TREATMENT   Your caregiver may prescribe treatment for an underlying medical disorders. Your caregiver can give advice  or help if you are using alcohol or other drugs for self-medication. Treatment of underlying problems will usually eliminate insomnia problems.  Medications can be prescribed for short time use. They are generally not recommended for lengthy use.  Over-the-counter sleep medicines are not recommended for lengthy use. They can be habit forming.  You can promote easier sleeping by making lifestyle changes such as:  Using relaxation techniques that help with breathing and reduce muscle tension.  Exercising earlier in the day.  Changing your diet and the time of your last meal. No night time snacks.  Establish a regular time to go to bed.  Counseling can help with stressful problems and worry.  Soothing music and white noise may be helpful if there are background noises you cannot remove.  Stop tedious detailed work at least one hour before bedtime. HOME CARE INSTRUCTIONS   Keep a diary. Inform your caregiver about your progress. This includes any medication side effects. See your caregiver regularly. Take note of:  Times when you are asleep.  Times when you are awake during the night.  The quality of your sleep.  How you feel the next day. This information will help your caregiver care for you.  Get out of bed if you are still awake after 15 minutes. Read or do some quiet activity. Keep the lights down. Wait until you feel sleepy and go back to  bed.  Keep regular sleeping and waking hours. Avoid naps.  Exercise regularly.  Avoid distractions at bedtime. Distractions include watching television or engaging in any intense or detailed activity like attempting to balance the household checkbook.  Develop a bedtime ritual. Keep a familiar routine of bathing, brushing your teeth, climbing into bed at the same time each night, listening to soothing music. Routines increase the success of falling to sleep faster.  Use relaxation techniques. This can be using breathing and muscle tension release routines. It can also include visualizing peaceful scenes. You can also help control troubling or intruding thoughts by keeping your mind occupied with boring or repetitive thoughts like the old concept of counting sheep. You can make it more creative like imagining planting one beautiful flower after another in your backyard garden.  During your day, work to eliminate stress. When this is not possible use some of the previous suggestions to help reduce the anxiety that accompanies stressful situations. MAKE SURE YOU:   Understand these instructions.  Will watch your condition.  Will get help right away if you are not doing well or get worse. Document Released: 06/30/2000 Document Revised: 09/25/2011 Document Reviewed: 07/31/2007 [facility] Patient Information 2015 [place], [place]. This information is not intended to replace advice given to you by your health care provider. Make sure you discuss any questions you have with your health care provider.      Neta Mends. Panosh M.D.

## 2014-09-05 NOTE — Patient Instructions (Addendum)
Menopausal can sometimes  Trigger  Sleep issues .  Awakenings.    Sleep deprived   Can cause fatigue for  this . Continue  Managing   Do not look at clock  continue to routine  Light in am and mid day and  Very dark .    Melatonin  3-4 hours pre sleep .  considier moving up appt  With Dr Vincente Poli.     Insomnia Insomnia is frequent trouble falling and/or staying asleep. Insomnia can be a long term problem or a short term problem. Both are common. Insomnia can be a short term problem when the wakefulness is related to a certain stress or worry. Long term insomnia is often related to ongoing stress during waking hours and/or poor sleeping habits. Overtime, sleep deprivation itself can make the problem worse. Every little thing feels more severe because you are overtired and your ability to cope is decreased. CAUSES   Stress, anxiety, and depression.  Poor sleeping habits.  Distractions such as TV in the bedroom.  Naps close to bedtime.  Engaging in emotionally charged conversations before bed.  Technical reading before sleep.  Alcohol and other sedatives. They may make the problem worse. They can hurt normal sleep patterns and normal dream activity.  Stimulants such as caffeine for several hours prior to bedtime.  Pain syndromes and shortness of breath can cause insomnia.  Exercise late at night.  Changing time zones may cause sleeping problems (jet lag). It is sometimes helpful to have someone observe your sleeping patterns. They should look for periods of not breathing during the night (sleep apnea). They should also look to see how long those periods last. If you live alone or observers are uncertain, you can also be observed at a sleep clinic where your sleep patterns will be professionally monitored. Sleep apnea requires a checkup and treatment. Give your caregivers your medical history. Give your caregivers observations your family has made about your sleep.  SYMPTOMS   Not  feeling rested in the morning.  Anxiety and restlessness at bedtime.  Difficulty falling and staying asleep. TREATMENT   Your caregiver may prescribe treatment for an underlying medical disorders. Your caregiver can give advice or help if you are using alcohol or other drugs for self-medication. Treatment of underlying problems will usually eliminate insomnia problems.  Medications can be prescribed for short time use. They are generally not recommended for lengthy use.  Over-the-counter sleep medicines are not recommended for lengthy use. They can be habit forming.  You can promote easier sleeping by making lifestyle changes such as:  Using relaxation techniques that help with breathing and reduce muscle tension.  Exercising earlier in the day.  Changing your diet and the time of your last meal. No night time snacks.  Establish a regular time to go to bed.  Counseling can help with stressful problems and worry.  Soothing music and white noise may be helpful if there are background noises you cannot remove.  Stop tedious detailed work at least one hour before bedtime. HOME CARE INSTRUCTIONS   Keep a diary. Inform your caregiver about your progress. This includes any medication side effects. See your caregiver regularly. Take note of:  Times when you are asleep.  Times when you are awake during the night.  The quality of your sleep.  How you feel the next day. This information will help your caregiver care for you.  Get out of bed if you are still awake after 15 minutes. Read or  do some quiet activity. Keep the lights down. Wait until you feel sleepy and go back to bed.  Keep regular sleeping and waking hours. Avoid naps.  Exercise regularly.  Avoid distractions at bedtime. Distractions include watching television or engaging in any intense or detailed activity like attempting to balance the household checkbook.  Develop a bedtime ritual. Keep a familiar routine of  bathing, brushing your teeth, climbing into bed at the same time each night, listening to soothing music. Routines increase the success of falling to sleep faster.  Use relaxation techniques. This can be using breathing and muscle tension release routines. It can also include visualizing peaceful scenes. You can also help control troubling or intruding thoughts by keeping your mind occupied with boring or repetitive thoughts like the old concept of counting sheep. You can make it more creative like imagining planting one beautiful flower after another in your backyard garden.  During your day, work to eliminate stress. When this is not possible use some of the previous suggestions to help reduce the anxiety that accompanies stressful situations. MAKE SURE YOU:   Understand these instructions.  Will watch your condition.  Will get help right away if you are not doing well or get worse. Document Released: 06/30/2000 Document Revised: 09/25/2011 Document Reviewed: 07/31/2007 [facility] Patient Information 2015 [place]. This information is not intended to replace advice given to you by your health care provider. Make sure you discuss any questions you have with your health care provider.

## 2014-09-07 ENCOUNTER — Encounter: Payer: Self-pay | Admitting: Gastroenterology

## 2014-09-09 ENCOUNTER — Ambulatory Visit (INDEPENDENT_AMBULATORY_CARE_PROVIDER_SITE_OTHER): Payer: Managed Care, Other (non HMO) | Admitting: Internal Medicine

## 2014-09-09 ENCOUNTER — Encounter: Payer: Self-pay | Admitting: Internal Medicine

## 2014-09-09 VITALS — BP 96/66 | HR 80 | Temp 98.1°F | Ht 64.0 in | Wt 153.2 lb

## 2014-09-09 DIAGNOSIS — G4700 Insomnia, unspecified: Secondary | ICD-10-CM

## 2014-09-09 DIAGNOSIS — R5383 Other fatigue: Secondary | ICD-10-CM

## 2014-09-09 MED ORDER — LORAZEPAM 0.5 MG PO TABS: 0.5000 mg | ORAL_TABLET | Freq: Every evening | ORAL | Status: AC | PRN

## 2014-09-09 NOTE — Progress Notes (Signed)
Subjective:    Patient ID: Frances Aguilar, female    DOB: 07-Mar-1966, 49 y.o.   MRN: [id number]  DOS:  09/09/2014 Type of visit - description : Follow-up Interval history: 6 weeks history of difficulty sleeping, she falls asleep okay but  wakes up in the middle of the night wide-awake and is unable to go back to sleep. Was seen at the [facility] clinic, prescribed Desyrel but does not like to take that medication This is happening most nights. She's not taking any new medications in fact she discontinue Pristiq around July 2015. Drinks one  caffeine beverage once a day in the morning.   Review of Systems  Denies depression or anxiety. No recent increased stress. Few days ago started again with episode of fatigue. Denies snoring, feeling sleepy. No chest pain, difficulty breathing, lower extremity edema or palpitations. + Hot flashes  Past Medical History  Diagnosis Date  . Unspecified hearing loss     hearing aid B  . External hemorrhoids without mention of complication   . Unspecified tinnitus   . Depression   . Fibrocystic breast   . GERD (gastroesophageal reflux disease)     h/o    Past Surgical History  Procedure Laterality Date  . Tendon release  1999    Right hand  . Breast cyst aspiration  1999    History   Social History  . Marital Status: Married    Spouse Name: N/A  . Number of Children: 1  . Years of Education: N/A   Occupational History  . works in [employer]    Social History Main Topics  . Smoking status: Never Smoker   . Smokeless tobacco: Never Used  . Alcohol Use: Yes     Comment: Rare  . Drug Use: No  . Sexual Activity: Not on file   Other Topics Concern  . Not on file   Social History Narrative   College degree, 1 biological daughter , 1 step daughter        Medication List       This list is accurate as of: 09/09/14 11:59 PM.  Always use your most recent med list.               levonorgestrel 20 MCG/24HR IUD  Commonly known as:   MIRENA  1 each by Intrauterine route once.     LORazepam 0.5 MG tablet  Commonly known as:  ATIVAN  Take 1 tablet (0.5 mg total) by mouth at bedtime as needed for sleep.     MULTIVITAMIN PO  Take 1 tablet by mouth daily.           Objective:   Physical Exam BP 96/66 mmHg  Pulse 80  Temp(Src) 98.1 F (36.7 C) (Oral)  Ht  (1.626 m)  Wt 153 lb 4 oz (69.514 kg)  BMI 26.29 kg/m2  SpO2 100%  General:   Well developed, well nourished . NAD.  HEENT:  Normocephalic . Face symmetric, atraumatic. No thyromegaly Lungs:  CTA B Normal respiratory effort, no intercostal retractions, no accessory muscle use. Heart: RRR,  no murmur.  Muscle skeletal: no pretibial edema bilaterally  Skin: Not pale. Not jaundice Neurologic:  alert & oriented X3.  Speech normal, gait appropriate for age and unassisted Psych--  Cognition and judgment appear intact.  Cooperative with normal attention span and concentration.  Behavior appropriate. No anxious or depressed appearing.       Assessment & Plan:      Today ,  I spent more than 25   min with the patient: >50% of the time counseling regards good sleep habits, different treatment options, reviewing the note from other provided, answering different questions from the patient.

## 2014-09-09 NOTE — Patient Instructions (Signed)
Take half or one tablet of Ativan at bedtime for insomnia.  If this is not working please let me know.  HEALTHY SLEEP Sleep hygiene: Basic rules for a good night's sleep  Sleep only as much as you need to feel rested and then get out of bed  Keep a regular sleep schedule  Avoid forcing sleep  Exercise regularly for at least 20 minutes, preferably 4 to 5 hours before bedtime  Avoid caffeinated beverages after lunch  Avoid alcohol near bedtime: no "night cap"  Avoid smoking, especially in the evening  Do not go to bed hungry  Adjust bedroom environment  Avoid prolonged use of light-emitting screens before bedtime   Deal with your worries before bedtime

## 2014-09-09 NOTE — Progress Notes (Signed)
Pre visit review using our clinic review tool, if applicable. No additional management support is needed unless otherwise documented below in the visit note. 

## 2014-09-10 DIAGNOSIS — G4700 Insomnia, unspecified: Secondary | ICD-10-CM | POA: Insufficient documentation

## 2014-09-10 NOTE — Assessment & Plan Note (Signed)
Episodes of fatigue, Treat  insomnia first and see how she does. Recommend to come back for a complete physical and blood work at her convenience

## 2014-09-10 NOTE — Assessment & Plan Note (Signed)
Difficulty sleeping for 6 weeks, no clear etiology, no depressed or anxious.  Decide not to take Desyrel as prescribed few days ago. discussed good sleep habits and encouraged  to practice them. Discussed options including melatonin, Ambien, benzodiazepines. She took melatonin recently and didn't work. Previously she took Ativan for anxiety and worked well for him. Plan:  Ativan at bedtime as needed. Patient will discussed hot flashes with gynecology, that can also interfere with sleep.

## 2014-09-15 ENCOUNTER — Other Ambulatory Visit: Payer: Self-pay | Admitting: Obstetrics and Gynecology

## 2014-09-15 DIAGNOSIS — N63 Unspecified lump in unspecified breast: Secondary | ICD-10-CM

## 2014-09-21 ENCOUNTER — Ambulatory Visit
Admission: RE | Admit: 2014-09-21 | Discharge: 2014-09-21 | Disposition: A | Discharge status: Home / Self Care | Payer: BLUE CROSS/BLUE SHIELD | Source: Ambulatory Visit | Attending: Obstetrics and Gynecology | Admitting: Obstetrics and Gynecology

## 2014-09-21 DIAGNOSIS — N63 Unspecified lump in unspecified breast: Secondary | ICD-10-CM

## 2014-10-06 ENCOUNTER — Other Ambulatory Visit: Payer: Self-pay

## 2014-10-07 LAB — CYTOLOGY - PAP

## 2015-02-19 ENCOUNTER — Encounter: Payer: Self-pay | Admitting: Nurse Practitioner

## 2015-03-01 ENCOUNTER — Encounter: Payer: BLUE CROSS/BLUE SHIELD | Admitting: Gastroenterology

## 2015-03-04 ENCOUNTER — Encounter: Payer: Self-pay | Admitting: Nurse Practitioner

## 2015-03-04 ENCOUNTER — Other Ambulatory Visit (INDEPENDENT_AMBULATORY_CARE_PROVIDER_SITE_OTHER): Payer: BLUE CROSS/BLUE SHIELD

## 2015-03-04 ENCOUNTER — Ambulatory Visit (INDEPENDENT_AMBULATORY_CARE_PROVIDER_SITE_OTHER): Payer: BLUE CROSS/BLUE SHIELD | Admitting: Nurse Practitioner

## 2015-03-04 VITALS — BP 102/62 | HR 76 | Ht 64.17 in | Wt 133.1 lb

## 2015-03-04 DIAGNOSIS — R195 Other fecal abnormalities: Secondary | ICD-10-CM | POA: Diagnosis not present

## 2015-03-04 DIAGNOSIS — R109 Unspecified abdominal pain: Secondary | ICD-10-CM | POA: Diagnosis not present

## 2015-03-04 DIAGNOSIS — R634 Abnormal weight loss: Secondary | ICD-10-CM

## 2015-03-04 DIAGNOSIS — R110 Nausea: Secondary | ICD-10-CM | POA: Diagnosis not present

## 2015-03-04 LAB — CBC WITH DIFFERENTIAL/PLATELET
BASOS PCT: 0.5 % (ref 0.0–3.0)
Basophils Absolute: 0 10*3/uL (ref 0.0–0.1)
EOS PCT: 1.6 % (ref 0.0–5.0)
Eosinophils Absolute: 0.1 10*3/uL (ref 0.0–0.7)
HEMATOCRIT: 39.1 % (ref 36.0–46.0)
HEMOGLOBIN: 13.3 g/dL (ref 12.0–15.0)
Lymphocytes Relative: 27.2 % (ref 12.0–46.0)
Lymphs Abs: 2 10*3/uL (ref 0.7–4.0)
MCHC: 34 g/dL (ref 30.0–36.0)
MCV: 91.5 fl (ref 78.0–100.0)
MONO ABS: 0.4 10*3/uL (ref 0.1–1.0)
Monocytes Relative: 5.9 % (ref 3.0–12.0)
Neutro Abs: 4.7 10*3/uL (ref 1.4–7.7)
Neutrophils Relative %: 64.8 % (ref 43.0–77.0)
Platelets: 213 10*3/uL (ref 150.0–400.0)
RBC: 4.27 Mil/uL (ref 3.87–5.11)
RDW: 12.3 % (ref 11.5–15.5)
WBC: 7.2 10*3/uL (ref 4.0–10.5)

## 2015-03-04 LAB — COMPREHENSIVE METABOLIC PANEL
ALBUMIN: 4.6 g/dL (ref 3.5–5.2)
ALT: 14 U/L (ref 0–35)
AST: 17 U/L (ref 0–37)
Alkaline Phosphatase: 59 U/L (ref 39–117)
BUN: 13 mg/dL (ref 6–23)
CALCIUM: 9.5 mg/dL (ref 8.4–10.5)
CHLORIDE: 109 meq/L (ref 96–112)
CO2: 25 mEq/L (ref 19–32)
Creatinine, Ser: 0.66 mg/dL (ref 0.40–1.20)
GFR: 100.62 mL/min (ref >60.00)
Glucose, Bld: 92 mg/dL (ref 70–99)
POTASSIUM: 3.6 meq/L (ref 3.5–5.1)
SODIUM: 140 meq/L (ref 135–145)
Total Bilirubin: 0.9 mg/dL (ref 0.2–1.2)
Total Protein: 6.9 g/dL (ref 6.0–8.3)

## 2015-03-04 LAB — AMYLASE: AMYLASE: 47 U/L (ref 27–131)

## 2015-03-04 LAB — LIPASE: LIPASE: 22 U/L (ref 11.0–59.0)

## 2015-03-04 MED ORDER — ONDANSETRON HCL 4 MG PO TABS: 4.0000 mg | ORAL_TABLET | Freq: Four times a day (QID) | ORAL | Status: DC | PRN

## 2015-03-04 MED ORDER — MOVIPREP 100 G PO SOLR: 1.0000 | Freq: Once | ORAL | Status: DC

## 2015-03-04 MED ORDER — METOCLOPRAMIDE HCL 10 MG PO TABS: ORAL_TABLET | ORAL | Status: DC

## 2015-03-04 NOTE — Progress Notes (Signed)
HPI :  Patient is a 49 year old female referred by her gynecologist. Patient has a family history of colon cancer in father, she is known to Dr. Christella Hartigan from prior colonoscopy in 2011. Patient was scheduled for surveillance colonoscopy in the next few weeks but procedure cancelled because she needed evaluation of upper GI symptoms.   Patient considers herself healthy. Several weeks ago she developed nausea, loose stools and non-radiating mid right abdominal pain. Symptoms lasted approximately 2 weeks, patient thought she had a stomach bug.  Nausea improved with Bismuth but she has a lot of gurgling in her stomach and constant right mid abdominal pain unrelated to meals, defecation or activity. No fevers. No significant NSAID use.  She has lost 15 pounds in 3 weeks.   Past Medical History  Diagnosis Date  . Unspecified hearing loss     hearing aid B  . External hemorrhoids without mention of complication   . Unspecified tinnitus   . Depression   . Fibrocystic breast   . GERD (gastroesophageal reflux disease)     h/o  . Abnormal Pap smear of vagina     Family History  Problem Relation Age of Onset  . Asthma Mother     M and sister   . Heart attack Neg Hx   . Stroke Neg Hx   . Alzheimer's disease Mother   . Diabetes Mother     M and sister  . Colon cancer Father     dx at age 21  . Prostate cancer Father     dx age 62  . Lymphoma Brother     Non-Hodgkins   Social History  Substance Use Topics  . Smoking status: Never Smoker   . Smokeless tobacco: Never Used  . Alcohol Use: Yes     Comment: Rare   Current Outpatient Prescriptions  Medication Sig Dispense Refill  . bismuth subsalicylate (PEPTO BISMOL) 262 MG/15ML suspension Take 30 mLs by mouth every 6 (six) hours as needed.    Marland Kitchen levonorgestrel (MIRENA) 20 MCG/24HR IUD 1 each by Intrauterine route once.      Marland Kitchen LORazepam (ATIVAN) 0.5 MG tablet Take 1 tablet (0.5 mg total) by mouth at bedtime as needed for sleep. 30 tablet  1  . MINIVELLE 0.05 MG/24HR patch APPLY TWICE WEEKLY UTD  12  . Multiple Vitamins-Minerals (MULTIVITAMIN PO) Take 1 tablet by mouth daily.    . progesterone (PROMETRIUM) 100 MG capsule TK ONE C PO HS  12  . temazepam (RESTORIL) 30 MG capsule TK 1 C PO QHS  0   No current facility-administered medications for this visit.   Allergies  Allergen Reactions  . Dilaudid [Hydromorphone Hcl]     Itching- given at time of surgery prev  . Relafen [Nabumetone] Other (See Comments)    Ringing in ears    Review of Systems: All systems reviewed and negative except where noted in HPI.    Physical Exam: BP 102/62 mmHg  Pulse 76  Ht 5' 4.17" (1.63 m)  Wt 133 lb 2 oz (60.385 kg)  BMI 22.73 kg/m2 Constitutional: Pleasant,well-developed, female in no acute distress. HEENT: Normocephalic and atraumatic. Conjunctivae are normal. No scleral icterus. Neck supple.  Cardiovascular: Normal rate, regular rhythm.  Pulmonary/chest: Effort normal and breath sounds normal. No wheezing, rales or rhonchi. Abdominal: Soft, nondistended, nontender. Bowel sounds active throughout. There are no masses palpable. No hepatomegaly. Extremities: no edema Lymphadenopathy: No cervical adenopathy noted. Neurological: Alert and oriented to person place and time. Skin:  Skin is warm and dry. No rashes noted. Psychiatric: Normal mood and affect. Behavior is normal.   ASSESSMENT AND PLAN:  1. Pleasant 49 year old female with several week history of nausea, right mid abdominal pain and significant weight loss.   Will obtain some basic labs.   Patient due for surveillance colonoscopy, will add on EGD for evaluation of symptoms. The benefits, risks, and potential complications of EGD with possible biopsies and were discussed with the patient and she agrees to proceed.   2. [facility] of colon cancer in father. Patient received recall colonosopy letter. The risks, benefits, and alternatives to colonoscopy with possible biopsy and  possible polypectomy were discussed with the patient and she consents to proceed.    Cc: Marvel Plan, MD

## 2015-03-04 NOTE — Patient Instructions (Addendum)
You have been scheduled for an endoscopy and colonoscopy. Please follow the written instructions given to you at your visit today. Please pick up your prep supplies at the pharmacy within the next 1-3 days. If you use inhalers (even only as needed), please bring them with you on the day of your procedure.  Your physician has requested that you go to the basement for the following lab work before leaving today: CMET, CBC, Lipase   We have sent the following medications to your pharmacy for you to pick up at your convenience: Reglan and Zofran

## 2015-03-09 ENCOUNTER — Ambulatory Visit (AMBULATORY_SURGERY_CENTER): Payer: BLUE CROSS/BLUE SHIELD | Admitting: Gastroenterology

## 2015-03-09 ENCOUNTER — Encounter: Payer: Self-pay | Admitting: Gastroenterology

## 2015-03-09 VITALS — BP 91/55 | HR 84 | Temp 99.8°F | Resp 19 | Ht 64.0 in | Wt 133.0 lb

## 2015-03-09 DIAGNOSIS — R195 Other fecal abnormalities: Secondary | ICD-10-CM

## 2015-03-09 DIAGNOSIS — K2990 Gastroduodenitis, unspecified, without bleeding: Secondary | ICD-10-CM

## 2015-03-09 DIAGNOSIS — Z800 Family history of malignant neoplasm of digestive organs: Secondary | ICD-10-CM | POA: Diagnosis not present

## 2015-03-09 DIAGNOSIS — R110 Nausea: Secondary | ICD-10-CM | POA: Diagnosis present

## 2015-03-09 DIAGNOSIS — K2970 Gastritis, unspecified, without bleeding: Secondary | ICD-10-CM | POA: Diagnosis not present

## 2015-03-09 DIAGNOSIS — R194 Change in bowel habit: Secondary | ICD-10-CM

## 2015-03-09 DIAGNOSIS — K2950 Unspecified chronic gastritis without bleeding: Secondary | ICD-10-CM | POA: Diagnosis not present

## 2015-03-09 MED ORDER — SODIUM CHLORIDE 0.9 % IV SOLN: 500.0000 mL | INTRAVENOUS | Status: DC

## 2015-03-09 NOTE — Patient Instructions (Signed)

## 2015-03-09 NOTE — Progress Notes (Signed)
Transferred to recovery room. A/O x3, pleased with MAC.  VSS.  Report to Suzanne, RN. 

## 2015-03-09 NOTE — Progress Notes (Signed)
Called to room to assist during endoscopic procedure.  Patient ID and intended procedure confirmed with present staff. Received instructions for my participation in the procedure from the performing physician.  

## 2015-03-09 NOTE — Op Note (Signed)
[facility] [address] [place], [address]   COLONOSCOPY PROCEDURE REPORT  PATIENT: Frances, Aguilar  MR#: #[id number] BIRTHDATE: Jul 31, 1965 , 49  yrs. old GENDER: female ENDOSCOPIST: Rachael Fee, MD PROCEDURE DATE:  03/09/2015 PROCEDURE:   Colonoscopy with biopsy and Colonoscopy, screening First Screening Colonoscopy - Avg.  risk and is 50 yrs.  old or older - No.  Prior Negative Screening - Now for repeat screening. Above average risk  History of Adenoma - Now for follow-up colonoscopy & has been > or = to 3 yrs.  N/A  high risk ASA CLASS:   Class II INDICATIONS:loose stools and screening, Screening for colonic neoplasia, and FH Colon or Rectal Adenocarcinoma (father). MEDICATIONS: Monitored anesthesia care and Propofol 250 mg IV  DESCRIPTION OF PROCEDURE:   After the risks benefits and alternatives of the procedure were thoroughly explained, informed consent was obtained.  The digital rectal exam revealed no abnormalities of the rectum.   The LB [id number]  endoscope was introduced through the anus and advanced to the terminal ileum which was intubated for a short distance. No adverse events experienced.   The quality of the prep was excellent.  The instrument was then slowly withdrawn as the colon was fully examined. Estimated blood loss is zero unless otherwise noted in this procedure report.   COLON FINDINGS: The examined terminal ileum appeared to be normal. A normal appearing cecum, ileocecal valve, and appendiceal orifice were identified.  The ascending, transverse, descending, sigmoid colon, and rectum appeared unremarkable.  Multiple random biopsies were performed using cold forceps.  Samples were sent to R/O microscopic colitis.  Retroflexed views revealed no abnormalities. The time to cecum = 5.5 Withdrawal time = 6.4   The scope was withdrawn and the procedure completed. COMPLICATIONS: There were no immediate  complications.  ENDOSCOPIC IMPRESSION: 1.   The examined terminal ileum appeared to be normal 2.   Normal colon (no polyps, no inflammation, no cancers); multiple random biopsies were performed using cold forceps  RECOMMENDATIONS: Given your significant family history of colon cancer, you should have a repeat colonoscopy in 5 years Await final pathology from the random colon biopsies for further recommendations. EGD now.  eSigned:  Rachael Fee, MD 03/09/2015 1:59 PM   cc: Dr. Vincente Poli

## 2015-03-09 NOTE — Op Note (Signed)
[facility] [address] [place], [address]   ENDOSCOPY PROCEDURE REPORT  PATIENT: Frances Aguilar, Frances Aguilar  MR#: #[id number] BIRTHDATE: [birth year] , 49  yrs. old GENDER: female ENDOSCOPIST: Rachael Fee, MD PROCEDURE DATE:  03/09/2015 PROCEDURE:  EGD w/ biopsy ASA CLASS:     Class II INDICATIONS:  nausea, weight loss. MEDICATIONS: Monitored anesthesia care, Residual sedation present, and Propofol 150 mg IV TOPICAL ANESTHETIC: none  DESCRIPTION OF PROCEDURE: After the risks benefits and alternatives of the procedure were thoroughly explained, informed consent was obtained.  The LB [id number] endoscope was introduced through the mouth and advanced to the second portion of the duodenum , Without limitations.  The instrument was slowly withdrawn as the mucosa was fully examined.  There was mild, non-specific distal gastritis.  This was biopsied and sent to pathology.  The examination was otherwise normal. Retroflexed views revealed no abnormalities.     The scope was then withdrawn from the patient and the procedure completed.  COMPLICATIONS: There were no immediate complications.  ENDOSCOPIC IMPRESSION: There was mild, non-specific distal gastritis.  This was biopsied and sent to pathology.  The examination was otherwise normal  RECOMMENDATIONS: Await biopsy results.  If there is no H. pylori then you will likely need further testing for your nausea/weight loss (CT scan or [place])     eSigned:  Rachael Fee, MD 03/09/2015 2:02 PM    HQ:[id number] Vincente Poli, MD

## 2015-03-09 NOTE — Progress Notes (Signed)
i agree with the above note, plan 

## 2015-03-10 ENCOUNTER — Telehealth: Payer: Self-pay

## 2015-03-10 NOTE — Telephone Encounter (Signed)
  Follow up Call-  Call back number 03/09/2015  Post procedure Call Back phone  # [phone]  Permission to leave phone message Yes     Patient questions:  Do you have a fever, pain , or abdominal swelling? No. Pain Score  0 *  Have you tolerated food without any problems? Yes.    Have you been able to return to your normal activities? Yes.    Do you have any questions about your discharge instructions: Diet   No. Medications  No. Follow up visit  No.  Do you have questions or concerns about your Care? No.  Actions: * If pain score is 4 or above: No action needed, pain <4.

## 2015-03-17 ENCOUNTER — Telehealth: Payer: Self-pay | Admitting: Gastroenterology

## 2015-03-17 NOTE — Telephone Encounter (Signed)
Symptoms persistent with nausea, abdominal pain and diarrhea. She just doesn't feel like she is able to eat enough. She has nausea medications, but uses them sparingly due to the drowsiness. Her biopsy results do not show H-Pylori. Do you want to go forward with the abdominal CT?

## 2015-03-18 ENCOUNTER — Telehealth: Payer: Self-pay | Admitting: Gastroenterology

## 2015-03-18 DIAGNOSIS — R112 Nausea with vomiting, unspecified: Secondary | ICD-10-CM

## 2015-03-18 DIAGNOSIS — R109 Unspecified abdominal pain: Secondary | ICD-10-CM

## 2015-03-18 DIAGNOSIS — R634 Abnormal weight loss: Secondary | ICD-10-CM

## 2015-03-18 NOTE — Telephone Encounter (Signed)
The pt has been instructed and will pick up contrast at the front desk     You have been scheduled for a CT scan of the abdomen and pelvis at [facility] ([address]---this is in the same building as Press [facility]).   You are scheduled on 03/25/15 at 230 pm. You should arrive 15 minutes prior to your appointment time for registration. Please follow the written instructions below on the day of your exam:  WARNING: IF YOU ARE ALLERGIC TO IODINE/X-RAY DYE, PLEASE NOTIFY RADIOLOGY IMMEDIATELY AT [phone]! YOU WILL BE GIVEN A 13 HOUR PREMEDICATION PREP.  1) Do not eat or drink anything after 1030 am (4 hours prior to your test) 2) You have been given 2 bottles of oral contrast to drink. The solution may taste better if refrigerated, but do NOT add ice or any other liquid to this solution. Shake well before drinking.  Drink 1 bottle of contrast @ 1230 pm (2 hours prior to your exam)  Drink 1 bottle of contrast @ 130 pm (1 hour prior to your exam)  You may take any medications as prescribed with a small amount of water except for the following: Metformin, Glucophage, Glucovance, Avandamet, Riomet, Fortamet, Actoplus Met, Janumet, Glumetza or Metaglip. The above medications must be held the day of the exam AND 48 hours after the exam.  The purpose of you drinking the oral contrast is to aid in the visualization of your intestinal tract. The contrast solution may cause some diarrhea. Before your exam is started, you will be given a small amount of fluid to drink. Depending on your individual set of symptoms, you may also receive an intravenous injection of x-ray contrast/dye. Plan on being at [facility] for 30 minutes or long, depending on the type of exam you are having performed.  This test typically takes 30-45 minutes to complete.  If you have any questions regarding your exam or if you need to reschedule, you may call the CT department at [phone] between the  hours of 8:00 am and 5:00 pm, Monday-Friday.  ________________________________________________________________________

## 2015-03-18 NOTE — Telephone Encounter (Signed)
See result note from today 

## 2015-03-18 NOTE — Telephone Encounter (Signed)
Frances Aguilar,     Please call her. The biopsies from colon and stomach were both normal. She needs CT scan abd/pelvis with IV and oral contrast to continue the workup for her abd pain, nausea, weight loss. Thanks            Clydie Braun, no result note is needed.

## 2015-03-24 ENCOUNTER — Telehealth: Payer: Self-pay | Admitting: Gastroenterology

## 2015-03-24 NOTE — Telephone Encounter (Signed)
Patient was notified that we did check amylase and lipase on 03-04-15 and labs were normal. Pt was advised to go to her scheduled CT appt for tomorrow. Pt verbalized understanding and has no question at this time. Patien is aware that we will contact her regarding the CT result once they have been reviewed.

## 2015-03-24 NOTE — Telephone Encounter (Signed)
Patient saw her OB-GYN today. Her gyn thinks that patient may have pancreatic enzyme deficiency. Patient is wanting to know if Dr. Christella Hartigan would check her pancreatic enzyme's to see if she does have a deficiency.

## 2015-03-25 ENCOUNTER — Ambulatory Visit (INDEPENDENT_AMBULATORY_CARE_PROVIDER_SITE_OTHER)
Admission: RE | Admit: 2015-03-25 | Discharge: 2015-03-25 | Disposition: A | Discharge status: Home / Self Care | Payer: BLUE CROSS/BLUE SHIELD | Source: Ambulatory Visit | Attending: Gastroenterology | Admitting: Gastroenterology

## 2015-03-25 DIAGNOSIS — R634 Abnormal weight loss: Secondary | ICD-10-CM

## 2015-03-25 DIAGNOSIS — R109 Unspecified abdominal pain: Secondary | ICD-10-CM | POA: Diagnosis not present

## 2015-03-25 DIAGNOSIS — R112 Nausea with vomiting, unspecified: Secondary | ICD-10-CM | POA: Diagnosis not present

## 2015-03-25 MED ORDER — IOHEXOL 300 MG/ML  SOLN
100.0000 mL | Freq: Once | INTRAMUSCULAR | Status: AC | PRN
  Administered 2015-03-25: 100 mL via INTRAVENOUS

## 2015-03-26 ENCOUNTER — Other Ambulatory Visit: Payer: Self-pay

## 2015-03-26 ENCOUNTER — Telehealth: Payer: Self-pay | Admitting: Gastroenterology

## 2015-03-26 DIAGNOSIS — R1084 Generalized abdominal pain: Secondary | ICD-10-CM

## 2015-03-26 DIAGNOSIS — K769 Liver disease, unspecified: Secondary | ICD-10-CM

## 2015-03-26 NOTE — Telephone Encounter (Signed)
Spoke with pt and she is aware. Order in epic for labs. Discussed with pt that MRI will be scheduled Monday and we will call her with an appt then.

## 2015-03-26 NOTE — Telephone Encounter (Signed)
Just looked at it.  Please see results note from the CT, thanks

## 2015-03-26 NOTE — Telephone Encounter (Signed)
Pt calling for ct results, please advise. 

## 2015-03-26 NOTE — Telephone Encounter (Signed)
See my results note from today, thanks

## 2015-03-29 ENCOUNTER — Other Ambulatory Visit: Payer: BLUE CROSS/BLUE SHIELD

## 2015-03-29 DIAGNOSIS — R1084 Generalized abdominal pain: Secondary | ICD-10-CM

## 2015-03-30 LAB — RETICULIN ANTIBODIES, IGA W TITER: Reticulin Ab, IgA: NEGATIVE

## 2015-03-30 NOTE — Telephone Encounter (Signed)
Was told she would get a call back yesterday Monday with an MRI appointment.

## 2015-03-30 NOTE — Telephone Encounter (Signed)
Ok, please see my results note from the recent MRI.

## 2015-03-30 NOTE — Telephone Encounter (Signed)
Please call the patient. The ct scan does not really explain her symptoms but there was a small spot on her liver. This is probably a hemangioma (abnormal blood vessel) but radiologist recommende MRI with and without gadolinium to best characterize it. I agree. Can you please arrange that MRI. Also would like more bloodwork to check for sprue (celiac panel). thanks      You have been scheduled for an MRI at [facility] on 04/08/15. Your appointment time is 8 am. Please arrive 15 minutes prior to your appointment time for registration purposes. Please make certain not to have anything to eat or drink 6 hours prior to your test. In addition, if you have any metal in your body, have a pacemaker or defibrillator, please be sure to let your ordering physician know. This test typically takes 45 minutes to 1 hour to complete.  The pt has been notified and instructed and will have labs

## 2015-03-31 ENCOUNTER — Telehealth: Payer: Self-pay | Admitting: Gastroenterology

## 2015-03-31 LAB — TISSUE TRANSGLUTAMINASE, IGA: Tissue Transglutaminase Ab, IgA: 1 U/mL (ref <4)

## 2015-03-31 LAB — GLIADIN ANTIBODIES, SERUM
Gliadin IgA: 1 Units (ref <20)
Gliadin IgG: 3 Units (ref <20)

## 2015-04-01 NOTE — Telephone Encounter (Signed)
Pt called back and said "I am very frustrated" I was told I would get a call back yesterday about my lab results. She stated she understood that Dr. Christella Hartigan is out of the office but someone else in the practice could review my lab results. "He is not the only physician that works in the practice, and I am very upset"

## 2015-04-01 NOTE — Telephone Encounter (Signed)
Dr Christella Hartigan have you reviewed the labs for this pt?

## 2015-04-01 NOTE — Telephone Encounter (Signed)
Pt has been notified that the labs are within normal range and that Dr Christella Hartigan is out of the office until Monday and will review for any further recommendations at that time.  Pt asked for my name, I gave her the information and she stated she would keep her MRI appt next week.

## 2015-04-02 ENCOUNTER — Telehealth: Payer: Self-pay | Admitting: Gastroenterology

## 2015-04-02 NOTE — Telephone Encounter (Signed)
Pts MRI moved to [facility] for 04/08/15@8 :30am, pt to arrive there at 8am. Pt to be NPO after midnight. Pt aware, [facility] appt cancelled, order faxed to [phone].

## 2015-04-06 ENCOUNTER — Ambulatory Visit (HOSPITAL_COMMUNITY): Payer: BLUE CROSS/BLUE SHIELD

## 2015-04-07 ENCOUNTER — Telehealth: Payer: Self-pay | Admitting: Gastroenterology

## 2015-04-07 DIAGNOSIS — R195 Other fecal abnormalities: Secondary | ICD-10-CM

## 2015-04-07 DIAGNOSIS — R112 Nausea with vomiting, unspecified: Secondary | ICD-10-CM

## 2015-04-07 NOTE — Telephone Encounter (Signed)
Discussed with pt that results are not here. [facility] called and report being faxed over. Let pt know that results will be called when Dr. Christella Hartigan can review report.

## 2015-04-08 ENCOUNTER — Ambulatory Visit (HOSPITAL_COMMUNITY): Admission: RE | Admit: 2015-04-08 | Payer: BLUE CROSS/BLUE SHIELD | Source: Ambulatory Visit

## 2015-04-08 MED ORDER — ONDANSETRON HCL 4 MG PO TABS: 4.0000 mg | ORAL_TABLET | Freq: Every morning | ORAL | Status: AC

## 2015-04-08 MED ORDER — METRONIDAZOLE 250 MG PO TABS: 250.0000 mg | ORAL_TABLET | Freq: Three times a day (TID) | ORAL | Status: DC

## 2015-04-08 MED ORDER — ONDANSETRON HCL 4 MG PO TABS: 4.0000 mg | ORAL_TABLET | Freq: Three times a day (TID) | ORAL | Status: AC | PRN

## 2015-04-08 NOTE — Telephone Encounter (Signed)
I reviewed [facility] report from 2 days ago and spoke with her on the phone. 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  She is still miserable with nausea, vomiting, loose stools.  Colo, egd, CT, labs have failed to show why.  She needs: 1. Stool testing for pathogen panel and giardia by stool antigen. 2. After stool testing dropped off, start flagyl  pill, one pill tid for 10 days. 3. She needs new prescription for zofran  pill, take one pill every morning on scheduled basis, disp 30 with 3 refills. 4. HIDA scan with CCK to check for biliary dyskinesia.  thanks

## 2015-04-08 NOTE — Telephone Encounter (Signed)
You have been scheduled for a HIDA scan at [facility] (1st floor) on 04/14/15. Please arrive 15 minutes prior to your scheduled appointment at  730 am. Make certain not to have anything to eat or drink at least 6 hours prior to your test. Should this appointment date or time not work well for you, please call radiology scheduling at [phone].  _____________________________________________________________________ hepatobiliary (HIDA) scan is an imaging procedure used to diagnose problems in the liver, gallbladder and bile ducts. In the HIDA scan, a radioactive chemical or tracer is injected into a vein in your arm. The tracer is handled by the liver like bile. Bile is a fluid produced and excreted by your liver that helps your digestive system break down fats in the foods you eat. Bile is stored in your gallbladder and the gallbladder releases the bile when you eat a meal. A special nuclear medicine scanner (gamma camera) tracks the flow of the tracer from your liver into your gallbladder and small intestine.  During your HIDA scan  You'll be asked to change into a hospital gown before your HIDA scan begins. Your health care team will position you on a table, usually on your back. The radioactive tracer is then injected into a vein in your arm.The tracer travels through your bloodstream to your liver, where it's taken up by the bile-producing cells. The radioactive tracer travels with the bile from your liver into your gallbladder and through your bile ducts to your small intestine.You may feel some pressure while the radioactive tracer is injected into your vein. As you lie on the table, a special gamma camera is positioned over your abdomen taking pictures of the tracer as it moves through your body. The gamma camera takes pictures continually for about an hour. You'll need to keep still during the HIDA scan. This can become uncomfortable, but you may find that you can lessen the discomfort by  taking deep breaths and thinking about other things. Tell your health care team if you're uncomfortable. The radiologist will watch on a computer the progress of the radioactive tracer through your body. The HIDA scan may be stopped when the radioactive tracer is seen in the gallbladder and enters your small intestine. This typically takes about an hour. In some cases extra imaging will be performed if original images aren't satisfactory, if morphine is given to help visualize the gallbladder or if the medication CCK is given to look at the contraction of the gallbladder. This test typically takes 2 hours to complete. ________________________________________________________________________  Pt has been notified and aware we will call with results when available.

## 2015-04-12 ENCOUNTER — Encounter: Payer: BLUE CROSS/BLUE SHIELD | Admitting: Gastroenterology

## 2015-04-13 ENCOUNTER — Other Ambulatory Visit: Payer: BLUE CROSS/BLUE SHIELD

## 2015-04-13 DIAGNOSIS — R112 Nausea with vomiting, unspecified: Secondary | ICD-10-CM

## 2015-04-13 DIAGNOSIS — R195 Other fecal abnormalities: Secondary | ICD-10-CM

## 2015-04-14 ENCOUNTER — Encounter (HOSPITAL_COMMUNITY)
Admission: RE | Admit: 2015-04-14 | Discharge: 2015-04-14 | Disposition: A | Discharge status: Home / Self Care | Payer: BLUE CROSS/BLUE SHIELD | Source: Ambulatory Visit | Attending: Gastroenterology | Admitting: Gastroenterology

## 2015-04-14 DIAGNOSIS — R112 Nausea with vomiting, unspecified: Secondary | ICD-10-CM | POA: Insufficient documentation

## 2015-04-14 DIAGNOSIS — R195 Other fecal abnormalities: Secondary | ICD-10-CM | POA: Diagnosis present

## 2015-04-14 MED ORDER — SINCALIDE 5 MCG IJ SOLR
0.0200 ug/kg | Freq: Once | INTRAMUSCULAR | Status: AC
  Administered 2015-04-14: 1.2 ug via INTRAVENOUS

## 2015-04-14 MED ORDER — TECHNETIUM TC 99M MEBROFENIN IV KIT
5.4000 | PACK | Freq: Once | INTRAVENOUS | Status: DC | PRN
  Administered 2015-04-14: 5 via INTRAVENOUS
  Filled 2015-04-14: qty 6

## 2015-04-15 ENCOUNTER — Other Ambulatory Visit: Payer: Self-pay

## 2015-04-15 DIAGNOSIS — R109 Unspecified abdominal pain: Secondary | ICD-10-CM

## 2015-04-16 LAB — GASTROINTESTINAL PATHOGEN PANEL PCR

## 2015-04-19 ENCOUNTER — Telehealth: Payer: Self-pay | Admitting: Gastroenterology

## 2015-04-19 LAB — GIARDIA ANTIGEN: Giardia Screen (EIA): NEGATIVE

## 2015-04-20 MED ORDER — METRONIDAZOLE 250 MG PO TABS: 250.0000 mg | ORAL_TABLET | Freq: Three times a day (TID) | ORAL | Status: AC

## 2015-04-20 NOTE — Telephone Encounter (Signed)
Pt has been notified that flagyl was sent to the pharmacy as requested

## 2015-04-20 NOTE — Telephone Encounter (Signed)
Dr Frances Aguilar the pt states she feels better but not completley and would like to take another course of abx.  Do you agree and can I send?

## 2015-04-20 NOTE — Telephone Encounter (Signed)
Yes, that is OK.  Please reorder the flagyl.  Also ask her to call about 1 week after that course of abx to update on her symptoms. Also let her know that no clear infectious source was noted by stool testing.

## 2015-04-29 ENCOUNTER — Ambulatory Visit (HOSPITAL_COMMUNITY)
Admission: RE | Admit: 2015-04-29 | Discharge: 2015-04-29 | Disposition: A | Discharge status: Home / Self Care | Payer: BLUE CROSS/BLUE SHIELD | Source: Ambulatory Visit | Attending: Gastroenterology | Admitting: Gastroenterology

## 2015-04-29 DIAGNOSIS — K449 Diaphragmatic hernia without obstruction or gangrene: Secondary | ICD-10-CM | POA: Insufficient documentation

## 2015-04-29 DIAGNOSIS — R6881 Early satiety: Secondary | ICD-10-CM | POA: Insufficient documentation

## 2015-04-29 DIAGNOSIS — E119 Type 2 diabetes mellitus without complications: Secondary | ICD-10-CM | POA: Insufficient documentation

## 2015-04-29 DIAGNOSIS — R109 Unspecified abdominal pain: Secondary | ICD-10-CM

## 2015-04-29 DIAGNOSIS — R140 Abdominal distension (gaseous): Secondary | ICD-10-CM | POA: Diagnosis not present

## 2015-04-29 MED ORDER — TECHNETIUM TC 99M SULFUR COLLOID FILTERED
2.1000 | Freq: Once | INTRAVENOUS | Status: AC | PRN
  Administered 2015-04-29: 2.1 via INTRADERMAL

## 2015-06-09 ENCOUNTER — Telehealth: Payer: Self-pay | Admitting: Gastroenterology

## 2015-06-09 NOTE — Telephone Encounter (Signed)
Pt states she is still having problems and that she called the nurse line with [employer] and was told to call and get an appt with Dr. Christella HartiganJacobs. Pt states she is still having abdominal discomfort and losing about a pound to a pound and a half/week. There are no available appts with Dr. Christella HartiganJacobs. Please advise.

## 2015-06-09 NOTE — Telephone Encounter (Signed)
In the past 3 months she has had colonoscopy, EGD, CT scan, MRI, HIDA scan and gastric emptying scan.    I'm happy to see her next available ROV, she should see her PCP sooner if she needs since so many GI tests (see ) above have all been negative (it seems unlikely that her weight loss is a gi issue given all the above negative tests).

## 2015-06-14 NOTE — Telephone Encounter (Signed)
Pt aware and states she will follow-up with her PCP. Did not want to schedule OV with Dr. Christella HartiganJacobs at this time.

## 2015-07-15 ENCOUNTER — Other Ambulatory Visit: Payer: Self-pay | Admitting: Internal Medicine

## 2015-08-02 ENCOUNTER — Ambulatory Visit: Payer: BLUE CROSS/BLUE SHIELD | Admitting: Gastroenterology

## 2020-08-26 ENCOUNTER — Encounter: Payer: Self-pay | Admitting: Gastroenterology

## 2021-05-10 ENCOUNTER — Other Ambulatory Visit: Payer: Self-pay | Admitting: Obstetrics and Gynecology

## 2021-05-10 DIAGNOSIS — N6002 Solitary cyst of left breast: Secondary | ICD-10-CM

## 2021-05-10 DIAGNOSIS — N611 Abscess of the breast and nipple: Secondary | ICD-10-CM

## 2021-05-11 ENCOUNTER — Ambulatory Visit
Admission: RE | Admit: 2021-05-11 | Discharge: 2021-05-11 | Disposition: A | Discharge status: Home / Self Care | Payer: BC Managed Care – PPO | Source: Ambulatory Visit | Attending: Obstetrics and Gynecology | Admitting: Obstetrics and Gynecology

## 2021-05-11 ENCOUNTER — Other Ambulatory Visit: Payer: Self-pay | Admitting: Obstetrics and Gynecology

## 2021-05-11 ENCOUNTER — Ambulatory Visit
Admission: RE | Admit: 2021-05-11 | Discharge: 2021-05-11 | Disposition: A | Discharge status: Home / Self Care | Payer: BLUE CROSS/BLUE SHIELD | Source: Ambulatory Visit | Attending: Obstetrics and Gynecology | Admitting: Obstetrics and Gynecology

## 2021-05-11 ENCOUNTER — Other Ambulatory Visit: Payer: Self-pay

## 2021-05-11 DIAGNOSIS — N6002 Solitary cyst of left breast: Secondary | ICD-10-CM

## 2021-05-19 ENCOUNTER — Other Ambulatory Visit: Payer: Self-pay

## 2021-05-19 ENCOUNTER — Ambulatory Visit
Admission: RE | Admit: 2021-05-19 | Discharge: 2021-05-19 | Disposition: A | Discharge status: Home / Self Care | Payer: BC Managed Care – PPO | Source: Ambulatory Visit | Attending: Obstetrics and Gynecology | Admitting: Obstetrics and Gynecology

## 2021-05-19 DIAGNOSIS — N6002 Solitary cyst of left breast: Secondary | ICD-10-CM

## 2023-10-10 IMAGING — MG DIGITAL DIAGNOSTIC BILAT W/ TOMO W/ CAD
6 of 10 series · 6 of 30 positions shown · non-contrast
Comparison: Previous exams.

CLINICAL DATA: 55-year-old female with a palpable area of concern
in the left breast.

EXAM:
DIGITAL DIAGNOSTIC BILATERAL MAMMOGRAM WITH TOMOSYNTHESIS AND CAD;
ULTRASOUND LEFT BREAST LIMITED
TECHNIQUE: Bilateral digital diagnostic mammography and breast tomosynthesis
was performed. The images were evaluated with computer-aided
detection.; Targeted ultrasound examination of the left breast was
performed.

[R CC synth-2D]
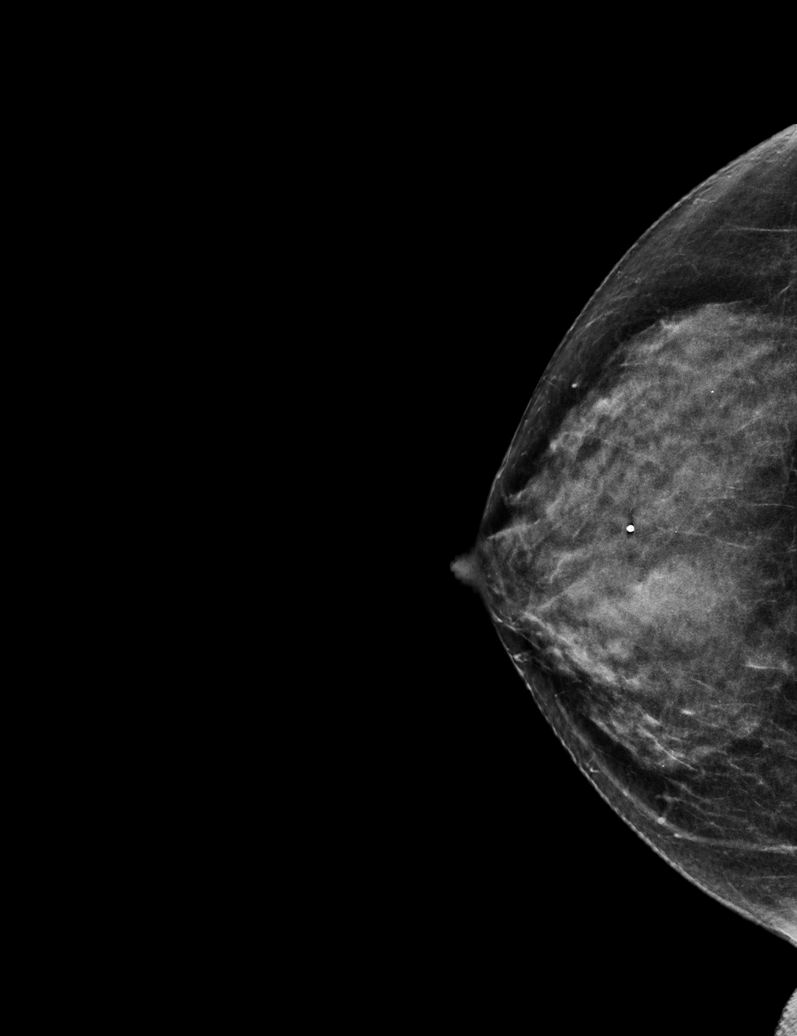

[L CC synth-2D]
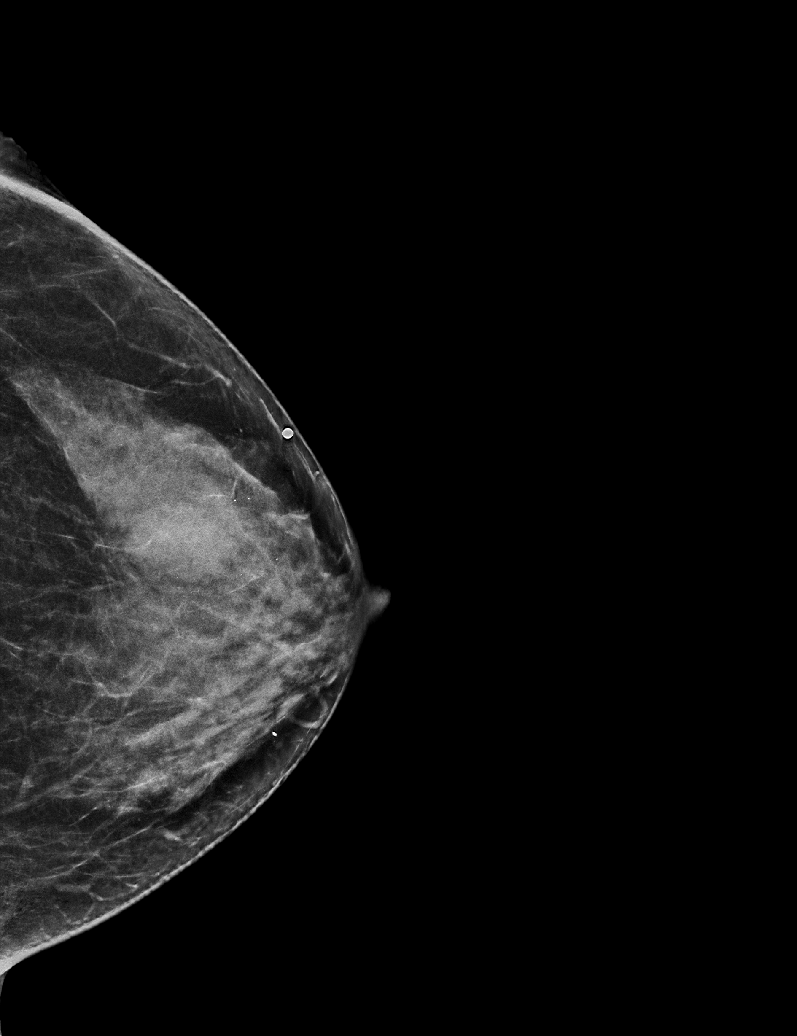

[L MLO synth-2D]
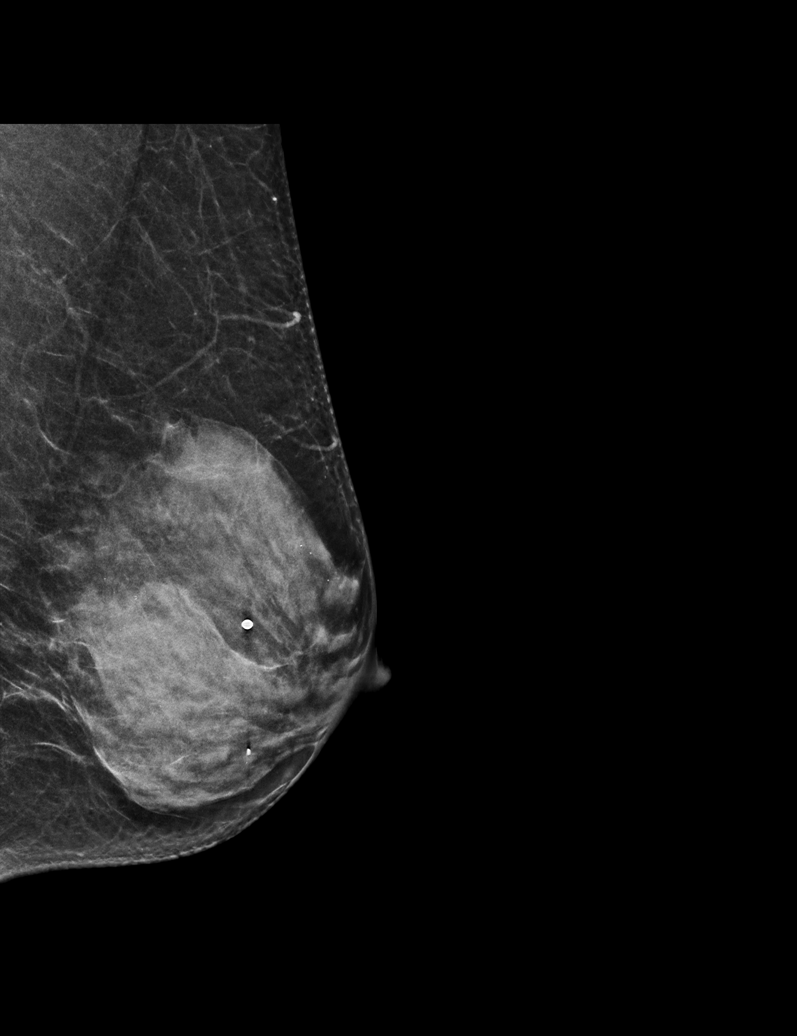

[R MLO synth-2D]
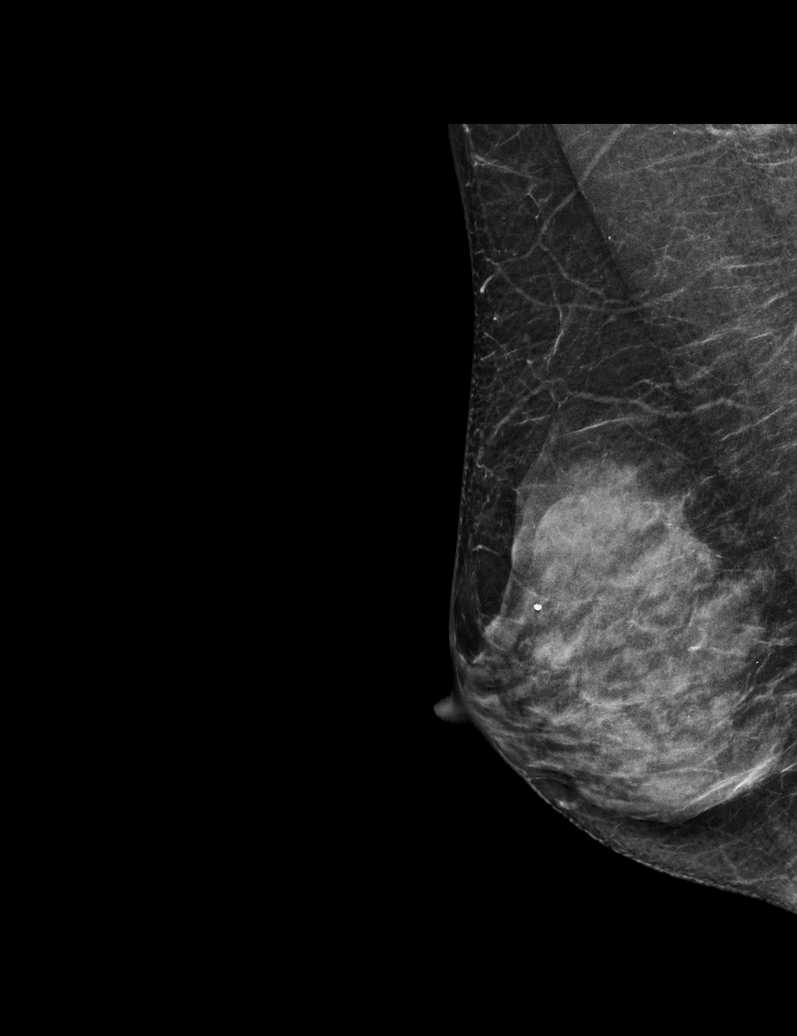

[L TAN synth-2D]
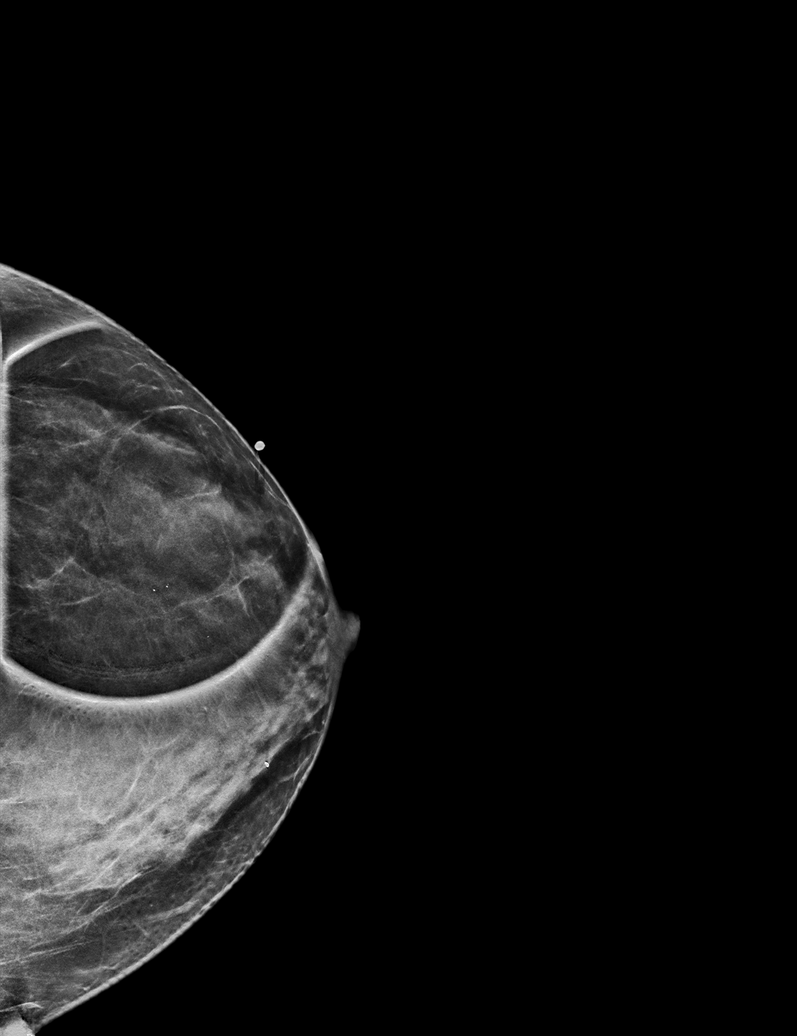

[L CC tomo · tomo slice 28/55.0]
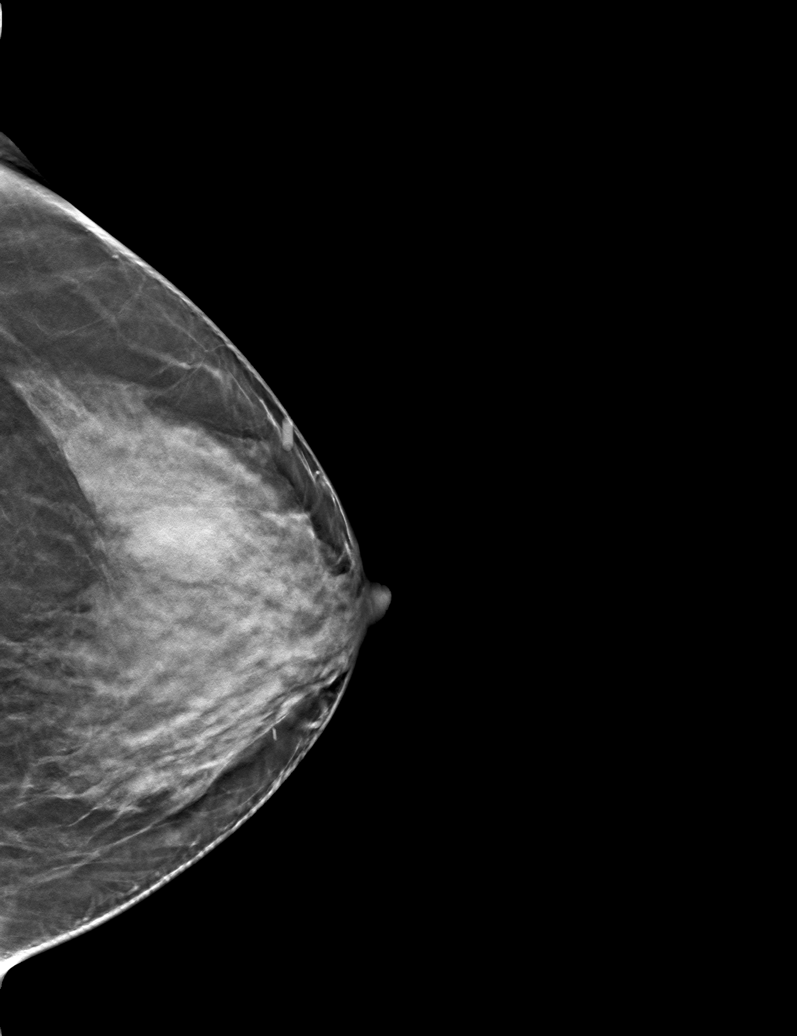

[6 of 30 positions shown; findings below may reference images not displayed]

ACR Breast Density Category d: The breast tissue is extremely dense,
which lowers the sensitivity of mammography.
FINDINGS: No suspicious masses or calcifications seen in the right breast.
Spot compression tomograms were performed over the area of palpable
concern in the slightly outer left breast demonstrating oval
circumscribed mass measuring nearly 3 cm.

Targeted ultrasound of the outer left breast at the site of palpable
concern was performed. There is a simple cyst at 3 o'clock 3 cm from
nipple measuring 2.9 x 1.7 x 2.6 cm. This corresponds well with the
mass seen in the slightly outer left breast at mammography.
IMPRESSION: 1.  2.9 cm cyst at site of palpable concern in the left breast.

2.  No findings of malignancy in either breast.

RECOMMENDATION:
1. The patient request cyst aspiration given large size and slight
discomfort and this procedure will be scheduled for the patient at
her convenience.

2.  Screening mammogram in one year.(Code:XA-Z-WSU)

I have discussed the findings and recommendations with the patient.
If applicable, a reminder letter will be sent to the patient
regarding the next appointment.

BI-RADS CATEGORY  2: Benign.

## 2023-10-18 IMAGING — US US BREAST CYST ASPIRATION 1ST CYST
1 series · 6 of 6 positions shown · non-contrast
Comparison: Previous exams.

CLINICAL DATA: 55-year-old with a palpable symptomatic (tender and
painful) 2.9 cm cyst involving the outer LEFT breast at the 3
o'clock position 3 cm from the nipple.

EXAM:
ULTRASOUND GUIDED LEFT BREAST CYST ASPIRATION

[Series 1: us breast cyst aspiration 1st cyst · 0.07mm/px · 6 of 6 slices shown]
[im 1/6]
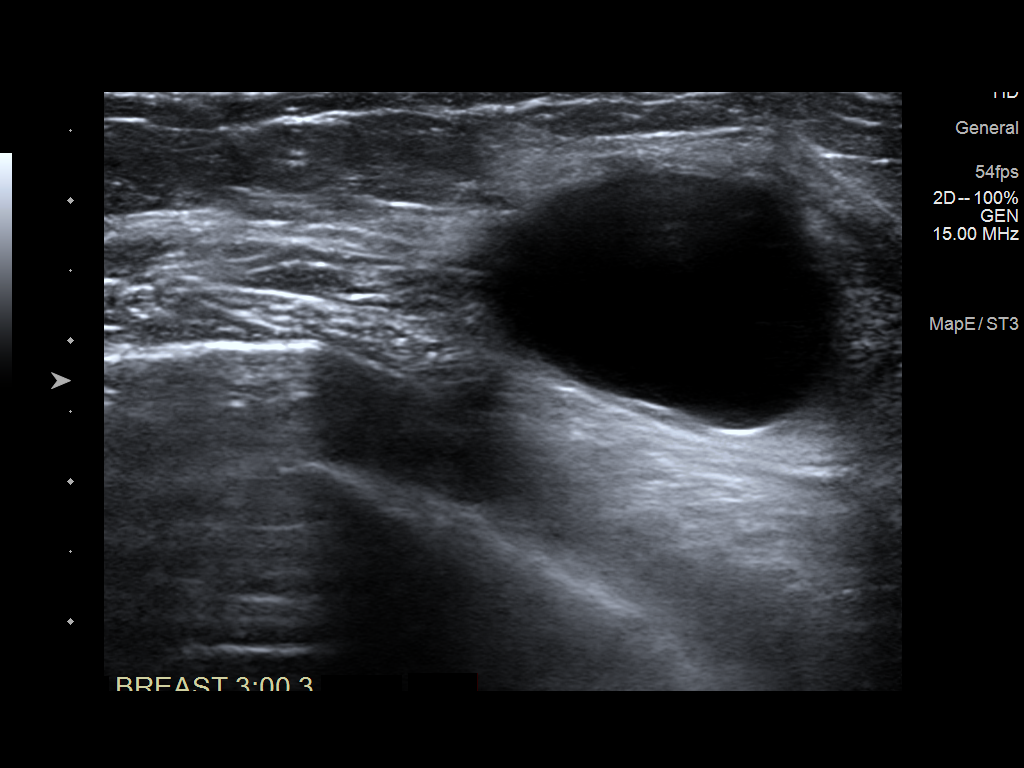
[im 2/6]
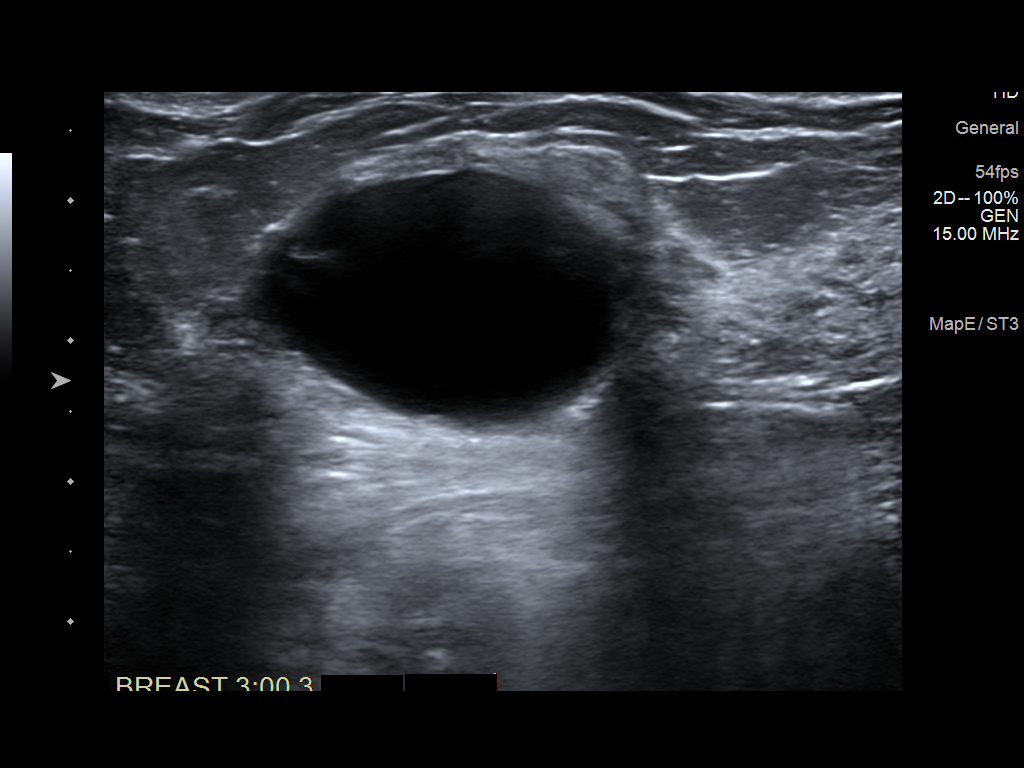
[im 3/6]
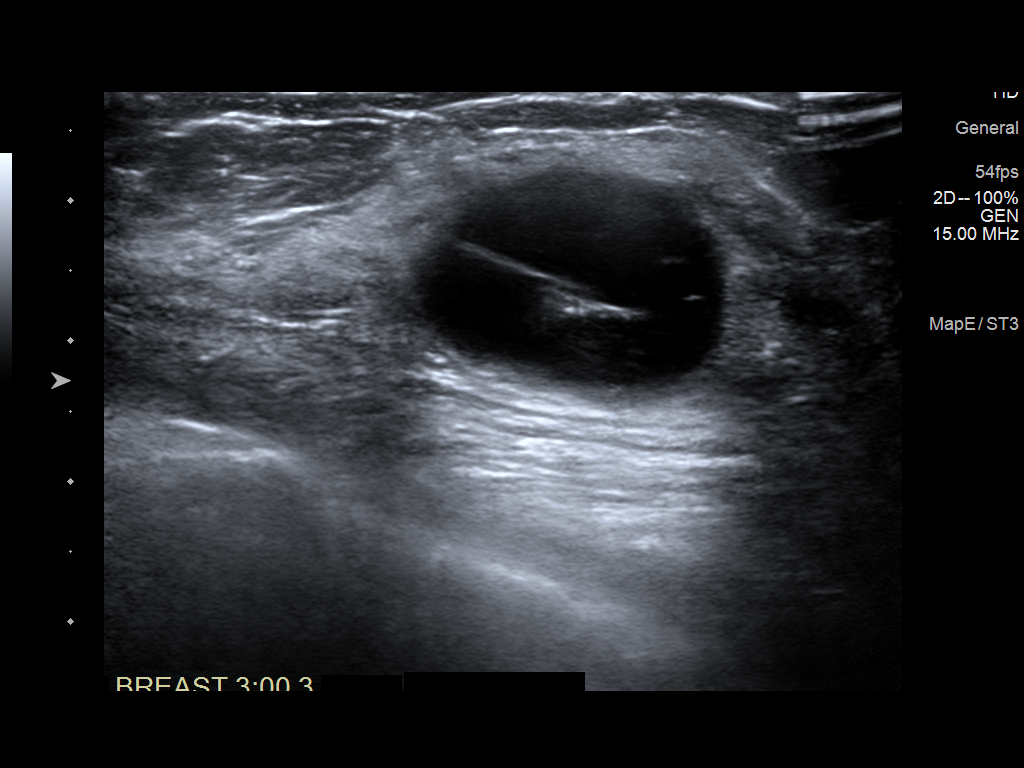
[im 4/6]
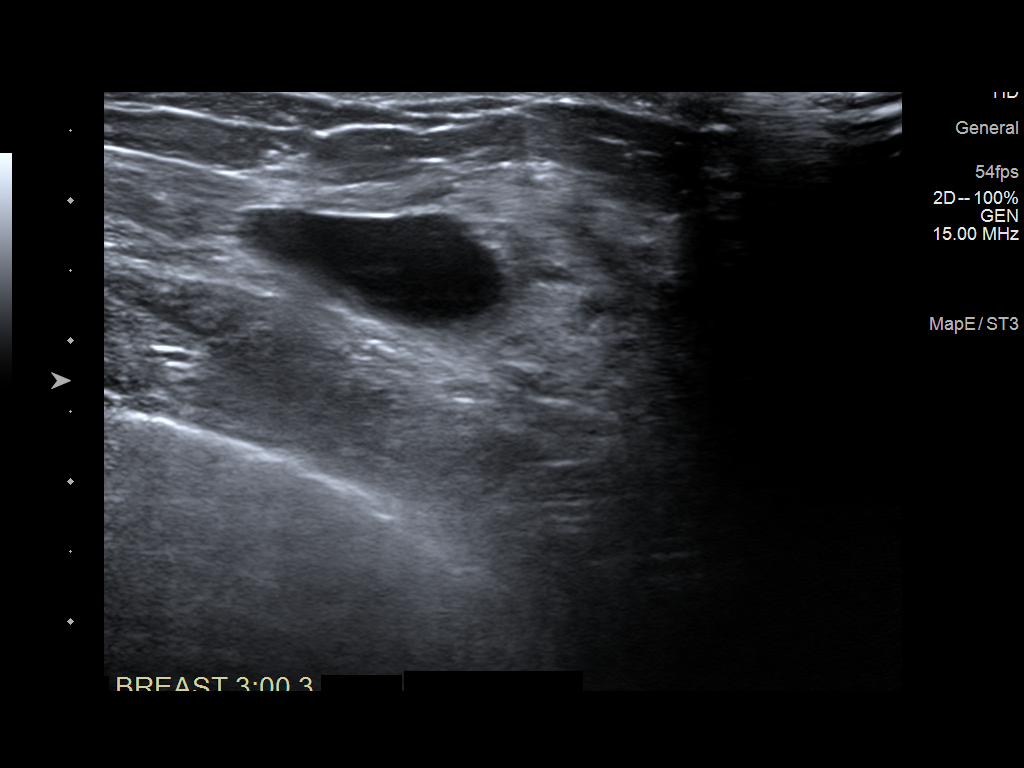
[im 5/6]
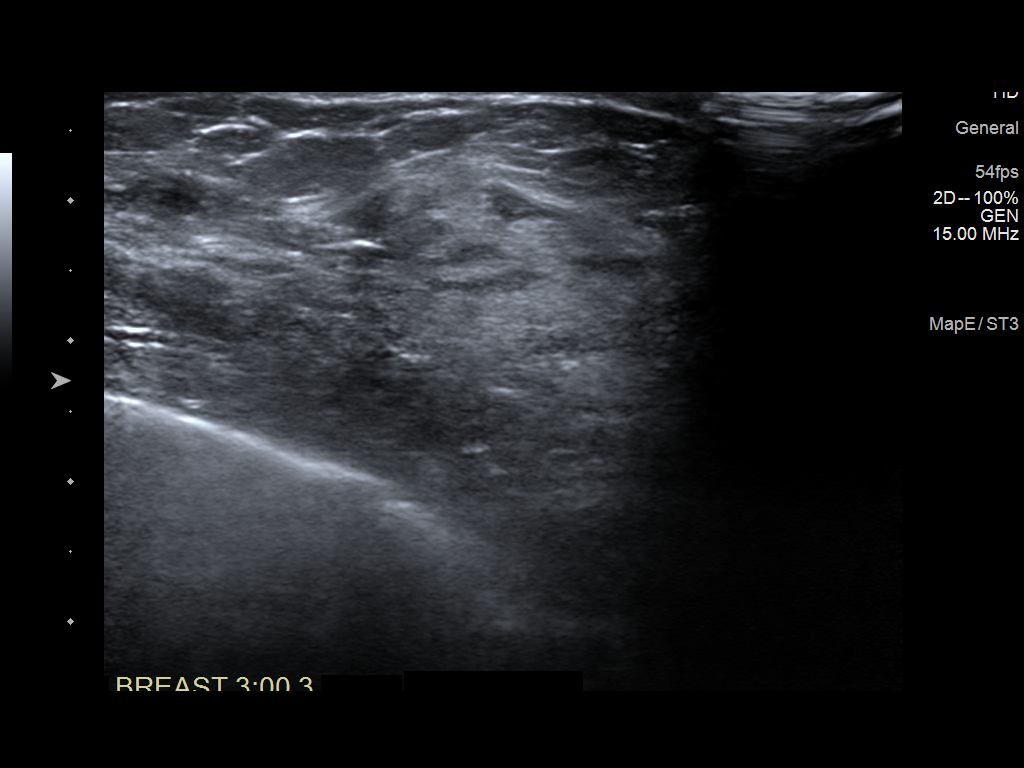
[im 6/6]
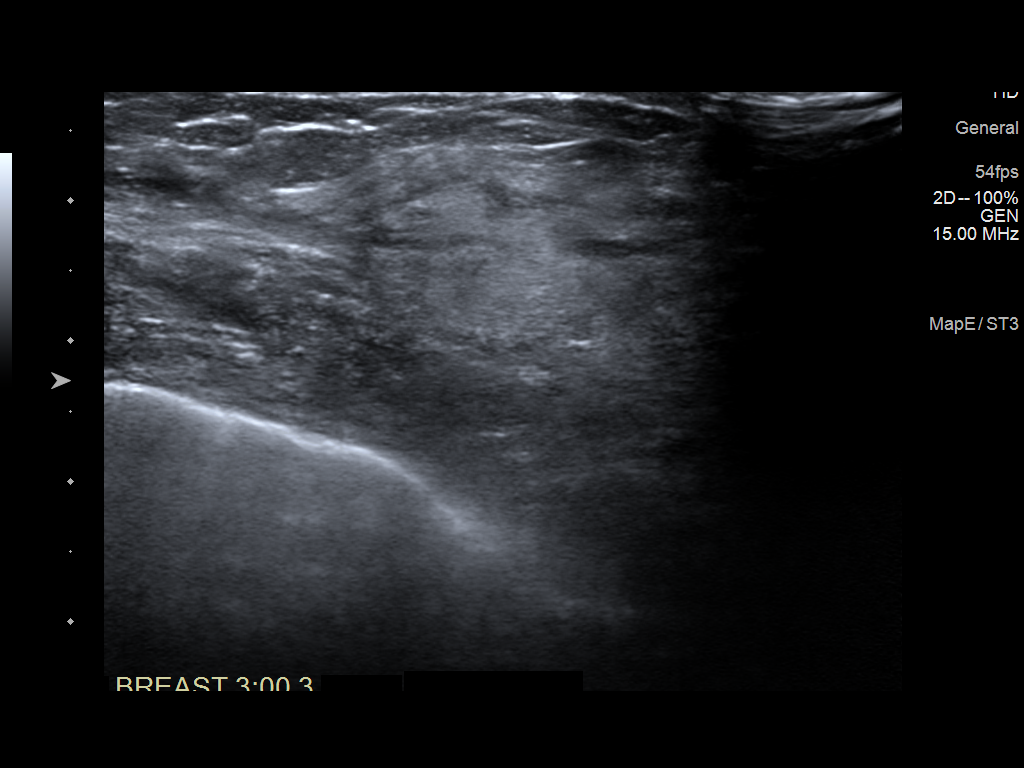

[6 of 6 positions shown; findings below may reference images not displayed]

PROCEDURE:
The patient and I discussed the procedure of ultrasound-guided
aspiration including benefits and alternatives. We discussed the
high likelihood of a successful procedure. We discussed the risks of
the procedure including infection, bleeding, tissue injury, and
inadequate sampling. Informed written consent was given. The usual
time out protocol was performed immediately prior to the procedure.

Using sterile technique with chlorhexidine as skin antisepsis, 1%
lidocaine as local anesthetic, under direct ultrasound
visualization, the cyst at the 3 o'clock position 3 cm from the
nipple was aspirated using a 21 gauge hypodermic needle.
Approximately 6 mL of gray colored cyst fluid were aspirated. The
cyst completely aspirated, and there is no solid component.
IMPRESSION: Ultrasound-guided aspiration of a symptomatic cyst involving the
outer LEFT breast. No apparent complications.

RECOMMENDATIONS:
Screening mammogram in one year.(Code:[id number]-I-IQG)

BI-RADS CATEGORY:

2: Benign.
